# Patient Record
Sex: Female | Born: 1970 | Race: Black or African American | Hispanic: No | Marital: Single | Smoking: Never smoker
Health system: Southern US, Community
[De-identification: ages and names within clinical notes are randomized; demographics above are authoritative.]

## PROBLEM LIST (undated history)

## (undated) DIAGNOSIS — F419 Anxiety disorder, unspecified: Secondary | ICD-10-CM

## (undated) DIAGNOSIS — E785 Hyperlipidemia, unspecified: Secondary | ICD-10-CM

## (undated) DIAGNOSIS — M1990 Unspecified osteoarthritis, unspecified site: Secondary | ICD-10-CM

## (undated) DIAGNOSIS — F32A Depression, unspecified: Secondary | ICD-10-CM

## (undated) HISTORY — PX: TUBAL LIGATION: SHX77

## (undated) HISTORY — DX: Anxiety disorder, unspecified: F41.9

## (undated) HISTORY — DX: Depression, unspecified: F32.A

## (undated) HISTORY — PX: LAPAROSCOPIC GASTRIC SLEEVE RESECTION: SHX5895

## (undated) HISTORY — DX: Hyperlipidemia, unspecified: E78.5

---

## 2014-05-10 HISTORY — PX: LAPAROSCOPIC GASTRIC SLEEVE RESECTION: SHX5895

## 2017-05-18 ENCOUNTER — Emergency Department (HOSPITAL_COMMUNITY): Payer: BLUE CROSS/BLUE SHIELD

## 2017-05-18 ENCOUNTER — Encounter (HOSPITAL_COMMUNITY): Payer: Self-pay | Admitting: Family Medicine

## 2017-05-18 ENCOUNTER — Encounter (HOSPITAL_COMMUNITY): Payer: Self-pay | Admitting: Emergency Medicine

## 2017-05-18 ENCOUNTER — Ambulatory Visit (HOSPITAL_COMMUNITY)
Admission: EM | Admit: 2017-05-18 | Discharge: 2017-05-18 | Disposition: A | Discharge status: Nursing Home (Includes ICF) | Payer: BLUE CROSS/BLUE SHIELD | Attending: Internal Medicine | Admitting: Internal Medicine

## 2017-05-18 ENCOUNTER — Emergency Department (HOSPITAL_COMMUNITY)
Admission: EM | Admit: 2017-05-18 | Discharge: 2017-05-18 | Disposition: A | Discharge status: Home / Self Care | Payer: BLUE CROSS/BLUE SHIELD | Attending: Emergency Medicine | Admitting: Emergency Medicine

## 2017-05-18 DIAGNOSIS — R079 Chest pain, unspecified: Secondary | ICD-10-CM | POA: Diagnosis not present

## 2017-05-18 DIAGNOSIS — Z5321 Procedure and treatment not carried out due to patient leaving prior to being seen by health care provider: Secondary | ICD-10-CM | POA: Insufficient documentation

## 2017-05-18 DIAGNOSIS — R0602 Shortness of breath: Secondary | ICD-10-CM | POA: Diagnosis present

## 2017-05-18 HISTORY — DX: Unspecified osteoarthritis, unspecified site: M19.90

## 2017-05-18 LAB — CBC
HCT: 39.9 % (ref 36.0–46.0)
Hemoglobin: 13.4 g/dL (ref 12.0–15.0)
MCH: 30.6 pg (ref 26.0–34.0)
MCHC: 33.6 g/dL (ref 30.0–36.0)
MCV: 91.1 fL (ref 78.0–100.0)
PLATELETS: 368 10*3/uL (ref 150–400)
RBC: 4.38 MIL/uL (ref 3.87–5.11)
RDW: 13 % (ref 11.5–15.5)
WBC: 7.9 10*3/uL (ref 4.0–10.5)

## 2017-05-18 LAB — BASIC METABOLIC PANEL
Anion gap: 8 (ref 5–15)
BUN: 6 mg/dL (ref 6–20)
CALCIUM: 8.7 mg/dL — AB (ref 8.9–10.3)
CO2: 23 mmol/L (ref 22–32)
CREATININE: 0.73 mg/dL (ref 0.44–1.00)
Chloride: 104 mmol/L (ref 101–111)
GFR calc non Af Amer: >60 mL/min (ref >60)
Glucose, Bld: 80 mg/dL (ref 65–99)
Potassium: 3.9 mmol/L (ref 3.5–5.1)
SODIUM: 135 mmol/L (ref 135–145)

## 2017-05-18 LAB — I-STAT TROPONIN, ED: TROPONIN I, POC: 0 ng/mL (ref 0.00–0.08)

## 2017-05-18 NOTE — ED Provider Notes (Signed)
CSN: [id number]     Arrival date & time 05/18/17  1846 History   None    Chief Complaint  Patient presents with  . Chest Pain   (Consider location/radiation/quality/duration/timing/severity/associated sxs/prior Treatment) Patient is a fairly healthy 46 y.o. Female with insignificant history, presents today for chest pain onset today while at rest accompany by shortness of breath and nausea. Nausea has resolved. She reports difficulty catching her breath; feels like she is trying to breath as if she is running. Chest pain locates at the center chest radiates to back. Pain is describe as pressure and heavy "like there is an elephant sitting on my chest". Denies chest wall tenderness. She endorses laying down makes SOB and CP worse. She denies jaw pain, arm pain, vomiting, abdominal pain, fatigue.         Past Medical History:  Diagnosis Date  . Arthritis    Past Surgical History:  Procedure Laterality Date  . CESAREAN SECTION    . LAPAROSCOPIC GASTRIC SLEEVE RESECTION    . TUBAL LIGATION     History reviewed. No pertinent family history. Social History  Substance Use Topics  . Smoking status: Not on file  . Smokeless tobacco: Not on file  . Alcohol use Not on file   OB History    No data available     Review of Systems  Constitutional: Negative for chills, fatigue and fever.  Respiratory: Positive for shortness of breath. Negative for cough and wheezing.   Cardiovascular: Positive for chest pain.  Gastrointestinal: Positive for nausea. Negative for abdominal pain, diarrhea and vomiting.  Musculoskeletal: Positive for back pain.  Neurological: Negative for dizziness and headaches.    Allergies  Patient has no known allergies.  Home Medications   Prior to Admission medications   Not on File   Meds Ordered and Administered this Visit  Medications - No data to display  BP 113/81   Pulse 70   Temp 98 F (36.7 C)   Resp 18   LMP 05/11/2017   SpO2 97%  No data  found.   Physical Exam  Constitutional: She is oriented to person, place, and time. She appears well-developed and well-nourished.  HENT:  Head: Normocephalic.  Right Ear: External ear normal.  Left Ear: External ear normal.  Nose: Nose normal.  Mouth/Throat: Oropharynx is clear and moist. No oropharyngeal exudate.  TM pearly gray with no erythema  Eyes: Conjunctivae are normal. Pupils are equal, round, and reactive to light.  Neck: Normal range of motion. Neck supple.  Cardiovascular: Normal rate, regular rhythm and normal heart sounds.   No murmur heard. No chest tenderness on palpation.   Pulmonary/Chest: Effort normal and breath sounds normal. No respiratory distress. She has no wheezes.  Abdominal: Soft. Bowel sounds are normal. There is no tenderness.  Musculoskeletal:  Struggles with laying down due to chest pain, pain worsen with laying down.   Lymphadenopathy:    She has no cervical adenopathy.  Neurological: She is alert and oriented to person, place, and time. Coordination normal.  Skin: Skin is warm and dry.  Psychiatric:  Appears anxious  Nursing note and vitals reviewed.   Urgent Care Course     Procedures (including critical care time)  Labs Review Labs Reviewed - No data to display  Imaging Review No results found.  MDM   1. Chest pain, unspecified type    EKG has NSR. Vital signs are appropriate. Most likely musculoskeletal, but given her symptoms and the characteristic of her  pain accompany by SOB,  will transfer patient via POV to ER for further comprehensive evaluation.     Lucia EstelleZheng, Quintel Mccalla, NP 05/18/17 1940

## 2017-05-18 NOTE — ED Notes (Signed)
At the desk complaining about the long wait.  Encouraged to stay but says no I am leaving.

## 2017-05-18 NOTE — ED Triage Notes (Signed)
Pt c/o 9/10 central CP and SOB that started this morning, having nausea and pain is radiating to her back.

## 2017-05-18 NOTE — ED Triage Notes (Signed)
Pt here for chest pain that started today with SOB and nausea,. sts that the [ain is worse with moving and breathing and with palpation. sts radiates into her back. Denies injury, coughing, fever, congestion.

## 2020-03-24 ENCOUNTER — Ambulatory Visit: Payer: BC Managed Care – PPO

## 2021-02-16 ENCOUNTER — Other Ambulatory Visit: Payer: Self-pay

## 2021-02-16 ENCOUNTER — Ambulatory Visit (INDEPENDENT_AMBULATORY_CARE_PROVIDER_SITE_OTHER): Payer: Managed Care, Other (non HMO) | Admitting: Family Medicine

## 2021-02-16 ENCOUNTER — Encounter: Payer: Self-pay | Admitting: Family Medicine

## 2021-02-16 VITALS — BP 121/84 | HR 72 | Ht 62.0 in | Wt 245.7 lb

## 2021-02-16 DIAGNOSIS — F3341 Major depressive disorder, recurrent, in partial remission: Secondary | ICD-10-CM | POA: Diagnosis not present

## 2021-02-16 DIAGNOSIS — F419 Anxiety disorder, unspecified: Secondary | ICD-10-CM | POA: Insufficient documentation

## 2021-02-16 DIAGNOSIS — Z6841 Body Mass Index (BMI) 40.0 and over, adult: Secondary | ICD-10-CM

## 2021-02-16 DIAGNOSIS — Z7689 Persons encountering health services in other specified circumstances: Secondary | ICD-10-CM | POA: Insufficient documentation

## 2021-02-16 MED ORDER — METHOCARBAMOL 750 MG PO TABS: 750.0000 mg | ORAL_TABLET | Freq: Four times a day (QID) | ORAL | 2 refills | Status: DC

## 2021-02-16 MED ORDER — ESCITALOPRAM OXALATE 20 MG PO TABS: 20.0000 mg | ORAL_TABLET | Freq: Every day | ORAL | 6 refills | Status: DC

## 2021-02-16 NOTE — Assessment & Plan Note (Signed)
Patient in today to establish primary care, she is doing well on her Lexapro for Major Depression and Anxiety. 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

## 2021-02-16 NOTE — Assessment & Plan Note (Signed)
Patient has several days that she has felt down and depressed. She has been out of her Lexapro.   Plan start Lexapro 20 mg Back.

## 2021-02-16 NOTE — Assessment & Plan Note (Signed)
Patient had gastric sleeve 5 years ago in [place]. She lost 80 lbs and gained 45 back. She is on a diet now. I encouraged her to consider the Mediterranean diet.

## 2021-02-16 NOTE — Progress Notes (Signed)
Established Patient Office Visit  SUBJECTIVE:  Subjective  Patient ID: Tracy Kane, female    DOB: Sep 20, 1971  Age: 50 y.o. MRN: [id number]  CC:  Chief Complaint  Patient presents with  . New Patient (Initial Visit)    HPI Tracy Kane is a 50 y.o. female presenting today for     Past Medical History:  Diagnosis Date  . Anxiety   . Arthritis   . Depression     Past Surgical History:  Procedure Laterality Date  . CESAREAN SECTION    . LAPAROSCOPIC GASTRIC SLEEVE RESECTION    . TUBAL LIGATION      History reviewed. No pertinent family history.  Social History   Socioeconomic History  . Marital status: Single    Spouse name: Not on file  . Number of children: Not on file  . Years of education: Not on file  . Highest education level: Not on file  Occupational History  . Not on file  Tobacco Use  . Smoking status: Never Smoker  . Smokeless tobacco: Never Used  Substance and Sexual Activity  . Alcohol use: No  . Drug use: No  . Sexual activity: Not Currently  Other Topics Concern  . Not on file  Social History Narrative  . Not on file   Social Determinants of Health   Financial Resource Strain: Not on file  Food Insecurity: Not on file  Transportation Needs: Not on file  Physical Activity: Not on file  Stress: Not on file  Social Connections: Not on file  Intimate Partner Violence: Not on file     Current Outpatient Medications:  .  methocarbamol (ROBAXIN-750) 750 MG tablet, Take 1 tablet (750 mg total) by mouth 4 (four) times daily., Disp: 30 tablet, Rfl: 2 .  escitalopram (LEXAPRO) 20 MG tablet, Take 1 tablet (20 mg total) by mouth daily., Disp: 30 tablet, Rfl: 6   No Known Allergies  ROS Review of Systems  Constitutional: Negative.   HENT: Negative.   Respiratory: Negative.   Cardiovascular: Negative.   Musculoskeletal: Negative.   Neurological: Negative.   Psychiatric/Behavioral: Negative.      OBJECTIVE:    Physical  Exam Constitutional:      Appearance: She is obese.  HENT:     Head: Normocephalic.     Mouth/Throat:     Mouth: Mucous membranes are moist.  Cardiovascular:     Rate and Rhythm: Normal rate and regular rhythm.  Musculoskeletal:        General: Normal range of motion.  Skin:    General: Skin is warm.  Neurological:     Mental Status: She is alert.  Psychiatric:        Mood and Affect: Mood normal.     BP 121/84   Pulse 72   Ht 5\' 2"  (1.575 m)   Wt 245 lb 11.2 oz (111.4 kg)   BMI 44.94 kg/m  Wt Readings from Last 3 Encounters:  02/16/21 245 lb 11.2 oz (111.4 kg)  05/18/17 204 lb (92.5 kg)    Health Maintenance Due  Topic Date Due  . Hepatitis C Screening  Never done  . COVID-19 Vaccine (1) Never done  . HIV Screening  Never done  . TETANUS/TDAP  Never done  . PAP SMEAR-Modifier  Never done  . COLONOSCOPY (Pts 45-78yrs Insurance coverage will need to be confirmed)  Never done  . INFLUENZA VACCINE  Never done    There are no preventive care reminders to display for this  patient.  CBC Latest Ref Rng & Units 05/18/2017  WBC 4.0 - 10.5 K/uL 7.9  Hemoglobin 12.0 - 15.0 g/dL 17.0  Hematocrit 01.7 - 46.0 % 39.9  Platelets 150 - 400 K/uL 368   CMP Latest Ref Rng & Units 05/18/2017  Glucose 65 - 99 mg/dL 80  BUN 6 - 20 mg/dL 6  Creatinine 4.94 - 4.96 mg/dL 7.59  Sodium 163 - 846 mmol/L 135  Potassium 3.5 - 5.1 mmol/L 3.9  Chloride 101 - 111 mmol/L 104  CO2 22 - 32 mmol/L 23  Calcium 8.9 - 10.3 mg/dL 6.5(L)    No results found for: TSH Lab Results  Component Value Date   ANIONGAP 8 05/18/2017   No results found for: CHOL, HDL, LDLCALC, CHOLHDL No results found for: TRIG No results found for: HGBA1C    ASSESSMENT & PLAN:   Problem List Items Addressed This Visit      Other   Encounter to establish care - Primary    Patient in today to establish primary care, she is doing well on her Lexapro for Major Depression and Anxiety. 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       Class 3 severe obesity due to excess calories without serious comorbidity with body mass index (BMI) of 40.0 to 44.9 in adult [facility])    Patient had gastric sleeve 5 years ago in [place]. She lost 80 lbs and gained 45 back. She is on a diet now. I encouraged her to consider the Mediterranean diet.        Recurrent major depressive disorder, in partial remission [facility])    Patient has several days that she has felt down and depressed. She has been out of her Lexapro.   Plan start Lexapro 20 mg Back.       Relevant Medications   escitalopram (LEXAPRO) 20 MG tablet      Meds ordered this encounter  Medications  . escitalopram (LEXAPRO) 20 MG tablet    Sig: Take 1 tablet (20 mg total) by mouth daily.    Dispense:  30 tablet    Refill:  6  . methocarbamol (ROBAXIN-750) 750 MG tablet    Sig: Take 1 tablet (750 mg total) by mouth 4 (four) times daily.    Dispense:  30 tablet    Refill:  2      Follow-up: No follow-ups on file.    Irish Lack, FNP [facility] [address] [place], [place] [address]

## 2021-03-16 ENCOUNTER — Encounter: Payer: Managed Care, Other (non HMO) | Admitting: Family Medicine

## 2021-05-12 ENCOUNTER — Other Ambulatory Visit: Payer: Self-pay

## 2021-05-12 DIAGNOSIS — M545 Low back pain, unspecified: Secondary | ICD-10-CM

## 2021-05-12 NOTE — Progress Notes (Signed)
amd

## 2021-06-05 ENCOUNTER — Emergency Department
Admission: EM | Admit: 2021-06-05 | Discharge: 2021-06-05 | Disposition: A | Discharge status: Home / Self Care | Payer: Managed Care, Other (non HMO) | Attending: Emergency Medicine | Admitting: Emergency Medicine

## 2021-06-05 ENCOUNTER — Encounter: Payer: Self-pay | Admitting: Emergency Medicine

## 2021-06-05 ENCOUNTER — Other Ambulatory Visit: Payer: Self-pay

## 2021-06-05 DIAGNOSIS — Z5321 Procedure and treatment not carried out due to patient leaving prior to being seen by health care provider: Secondary | ICD-10-CM | POA: Insufficient documentation

## 2021-06-05 DIAGNOSIS — R0981 Nasal congestion: Secondary | ICD-10-CM | POA: Diagnosis not present

## 2021-06-05 DIAGNOSIS — R059 Cough, unspecified: Secondary | ICD-10-CM | POA: Insufficient documentation

## 2021-06-05 DIAGNOSIS — R519 Headache, unspecified: Secondary | ICD-10-CM | POA: Insufficient documentation

## 2021-06-05 DIAGNOSIS — H9201 Otalgia, right ear: Secondary | ICD-10-CM | POA: Diagnosis not present

## 2021-06-05 NOTE — ED Notes (Signed)
[phone]   Racheal Patches, PA-C 06/05/21 2151

## 2021-06-05 NOTE — ED Triage Notes (Signed)
Patient ambulatory to triage with steady gait, without difficulty or distress noted; pt reports rt earache, nonprod cough since 6/14

## 2021-06-05 NOTE — ED Notes (Signed)
This RN attempted to locate patient in ED lobby. Patient not found. This RN attempted to contact patient via phone, as documented in patient's chart. No answer received. Provider notified. Patient's chart returned to waiting room.

## 2021-09-04 ENCOUNTER — Other Ambulatory Visit: Payer: Self-pay

## 2021-09-04 ENCOUNTER — Ambulatory Visit (INDEPENDENT_AMBULATORY_CARE_PROVIDER_SITE_OTHER): Payer: Managed Care, Other (non HMO) | Admitting: Internal Medicine

## 2021-09-04 ENCOUNTER — Encounter: Payer: Self-pay | Admitting: Internal Medicine

## 2021-09-04 VITALS — BP 123/75 | HR 72 | Temp 97.3°F | Resp 17 | Ht 62.0 in | Wt 243.8 lb

## 2021-09-04 DIAGNOSIS — Z23 Encounter for immunization: Secondary | ICD-10-CM

## 2021-09-04 DIAGNOSIS — E782 Mixed hyperlipidemia: Secondary | ICD-10-CM

## 2021-09-04 DIAGNOSIS — K219 Gastro-esophageal reflux disease without esophagitis: Secondary | ICD-10-CM | POA: Diagnosis not present

## 2021-09-04 DIAGNOSIS — Z9884 Bariatric surgery status: Secondary | ICD-10-CM | POA: Diagnosis not present

## 2021-09-04 DIAGNOSIS — N951 Menopausal and female climacteric states: Secondary | ICD-10-CM

## 2021-09-04 DIAGNOSIS — F419 Anxiety disorder, unspecified: Secondary | ICD-10-CM

## 2021-09-04 DIAGNOSIS — M8949 Other hypertrophic osteoarthropathy, multiple sites: Secondary | ICD-10-CM

## 2021-09-04 DIAGNOSIS — Z114 Encounter for screening for human immunodeficiency virus [HIV]: Secondary | ICD-10-CM

## 2021-09-04 DIAGNOSIS — Z1159 Encounter for screening for other viral diseases: Secondary | ICD-10-CM

## 2021-09-04 DIAGNOSIS — F32A Depression, unspecified: Secondary | ICD-10-CM

## 2021-09-04 DIAGNOSIS — M1990 Unspecified osteoarthritis, unspecified site: Secondary | ICD-10-CM | POA: Insufficient documentation

## 2021-09-04 DIAGNOSIS — M159 Polyosteoarthritis, unspecified: Secondary | ICD-10-CM

## 2021-09-04 DIAGNOSIS — M150 Primary generalized (osteo)arthritis: Secondary | ICD-10-CM

## 2021-09-04 NOTE — Assessment & Plan Note (Signed)
Encouraged her to try to identify foods that trigger her reflux and avoid them Encourage weight loss as this can help reduce reflux symptoms Continue Omeprazole

## 2021-09-04 NOTE — Assessment & Plan Note (Signed)
Stable on Escitalopram Support offered 

## 2021-09-04 NOTE — Assessment & Plan Note (Signed)
Encourage weight loss as this can produce joint pain She is following with [facility]

## 2021-09-04 NOTE — Addendum Note (Signed)
Addended by: Lonna Cobb on: 09/04/2021 11:47 AM   Modules accepted: Orders

## 2021-09-04 NOTE — Progress Notes (Signed)
HPI  Patient presents the clinic today to establish care.  She is transferring care from Marya Fossa, NP.  Anxiety and Depression: Chronic, managed on Escitalopram.  She is not currently seeing a therapist.  She denies SI/HI.  GERD: She is not sure what triggers this. She takes Omeprazole as needed with good relief. There is no upper GI on file.  OA: Mainly in her low back, left hip and bilateral hip.  She is following with [facility] in [place].  She reports she needs to have her B12 and iron levels checked.  She had a sleeve gastrectomy in 2015 and has not followed up with this in the last year.  Past Medical History:  Diagnosis Date   Anxiety    Arthritis    Depression     Current Outpatient Medications  Medication Sig Dispense Refill   escitalopram (LEXAPRO) 20 MG tablet Take 1 tablet (20 mg total) by mouth daily. 30 tablet 6   methocarbamol (ROBAXIN-750) 750 MG tablet Take 1 tablet (750 mg total) by mouth 4 (four) times daily. 30 tablet 2   No current facility-administered medications for this visit.    No Known Allergies  No family history on file.  Social History   Socioeconomic History   Marital status: Single    Spouse name: Not on file   Number of children: Not on file   Years of education: Not on file   Highest education level: Not on file  Occupational History   Not on file  Tobacco Use   Smoking status: Never   Smokeless tobacco: Never  Vaping Use   Vaping Use: Never used  Substance and Sexual Activity   Alcohol use: No   Drug use: No   Sexual activity: Not Currently  Other Topics Concern   Not on file  Social History Narrative   Not on file   Social Determinants of Health   Financial Resource Strain: Not on file  Food Insecurity: Not on file  Transportation Needs: Not on file  Physical Activity: Not on file  Stress: Not on file  Social Connections: Not on file  Intimate Partner Violence: Not on file    ROS:  Constitutional:  Denies fever, malaise, fatigue, headache or abrupt weight changes.  HEENT: Denies eye pain, eye redness, ear pain, ringing in the ears, wax buildup, runny nose, nasal congestion, bloody nose, or sore throat. Respiratory: Denies difficulty breathing, shortness of breath, cough or sputum production.   Cardiovascular: Denies chest pain, chest tightness, palpitations or swelling in the hands or feet.  Gastrointestinal: Denies abdominal pain, bloating, constipation, diarrhea or blood in the stool.  GU: Denies frequency, urgency, pain with urination, blood in urine, odor or discharge. Musculoskeletal: Patient reports chronic joint pain, hot of her upper back.  Denies decrease in range of motion, difficulty with gait, muscle pain or joint swelling.  Skin: Denies redness, rashes, lesions or ulcercations.  Neurological: Denies dizziness, difficulty with memory, difficulty with speech or problems with balance and coordination.  Psych: Patient has a history of anxiety and depression. Denies SI/HI.  No other specific complaints in a complete review of systems (except as listed in HPI above).  PE: BP 123/75 (BP Location: Left Arm, Patient Position: Sitting, Cuff Size: Large)   Pulse 72   Temp (!) 97.3 F (36.3 C) (Temporal)   Resp 17   Ht 5\' 2"  (1.575 m)   Wt 243 lb 12.8 oz (110.6 kg)   LMP 06/12/2021   SpO2 100%  BMI 44.59 kg/m   Wt Readings from Last 3 Encounters:  06/05/21 238 lb (108 kg)  02/16/21 245 lb 11.2 oz (111.4 kg)  05/18/17 204 lb (92.5 kg)    General: Appears her stated age, obese, in NAD. Skin: Fat pad noted over upper midline back. HEENT: Head: normal shape and size; Eyes: sclera white and EOMs intact;  Neck: Neck supple, trachea midline. No masses, lumps or thyromegaly present.  Cardiovascular: Normal rate and rhythm. S1,S2 noted.  No murmur, rubs or gallops noted. No JVD or BLE edema. Pulmonary/Chest: Normal effort and positive vesicular breath sounds. No respiratory  distress. No wheezes, rales or ronchi noted.  Abdomen: Soft and nontender. Normal bowel sounds. Musculoskeletal: No bony tenderness noted over the cervical or thoracic spine. No difficulty with gait.  Neurological: Alert and oriented.  Psychiatric: Mood and affect normal. Behavior is normal. Judgment and thought content normal.    BMET    Component Value Date/Time   NA 135 05/18/2017 1952   K 3.9 05/18/2017 1952   CL 104 05/18/2017 1952   CO2 23 05/18/2017 1952   GLUCOSE 80 05/18/2017 1952   BUN 6 05/18/2017 1952   CREATININE 0.73 05/18/2017 1952   CALCIUM 8.7 (L) 05/18/2017 1952   GFRNONAA >60 05/18/2017 1952   GFRAA >60 05/18/2017 1952    Lipid Panel  No results found for: CHOL, TRIG, HDL, CHOLHDL, VLDL, LDLCALC  CBC    Component Value Date/Time   WBC 7.9 05/18/2017 1952   RBC 4.38 05/18/2017 1952   HGB 13.4 05/18/2017 1952   HCT 39.9 05/18/2017 1952   PLT 368 05/18/2017 1952   MCV 91.1 05/18/2017 1952   MCH 30.6 05/18/2017 1952   MCHC 33.6 05/18/2017 1952   RDW 13.0 05/18/2017 1952    Hgb A1C No results found for: HGBA1C   Assessment and Plan:  Fat Pad of Upper Back:  Will check lipid and A1C today Encouraged diet and exercise for weight loss  S/P Sleeve Gastrectomy:  Will check CBC, iron panel, CMET, Lipid, A1C, Vit B 12  Screen for HIV:  HIV antibody today  Screen for Hep C:  Hep C antibody today  Perimenopausal:  FSH/LH today  Return precautions discussed  [name], NP This visit occurred during the SARS-CoV-2 public health emergency.  Safety protocols were in place, including screening questions prior to the visit, additional usage of staff PPE, and extensive cleaning of exam room while observing appropriate contact time as indicated for disinfecting solutions.

## 2021-09-04 NOTE — Patient Instructions (Signed)

## 2021-09-04 NOTE — Assessment & Plan Note (Signed)
Encourage diet and exercise for weight loss 

## 2021-09-05 ENCOUNTER — Encounter: Payer: Self-pay | Admitting: Obstetrics and Gynecology

## 2021-09-05 ENCOUNTER — Encounter: Payer: Managed Care, Other (non HMO) | Admitting: Obstetrics and Gynecology

## 2021-09-05 DIAGNOSIS — Z01419 Encounter for gynecological examination (general) (routine) without abnormal findings: Secondary | ICD-10-CM

## 2021-09-05 DIAGNOSIS — Z1231 Encounter for screening mammogram for malignant neoplasm of breast: Secondary | ICD-10-CM

## 2021-09-05 DIAGNOSIS — Z124 Encounter for screening for malignant neoplasm of cervix: Secondary | ICD-10-CM

## 2021-09-06 LAB — CBC
Hematocrit: 42.1 % (ref 34.0–46.6)
Hemoglobin: 14 g/dL (ref 11.1–15.9)
MCH: 30 pg (ref 26.6–33.0)
MCHC: 33.3 g/dL (ref 31.5–35.7)
MCV: 90 fL (ref 79–97)
Platelets: 402 10*3/uL (ref 150–450)
RBC: 4.67 x10E6/uL (ref 3.77–5.28)
RDW: 12.7 % (ref 11.7–15.4)
WBC: 6 10*3/uL (ref 3.4–10.8)

## 2021-09-06 LAB — COMPREHENSIVE METABOLIC PANEL
ALT: 10 IU/L (ref 0–32)
AST: 18 IU/L (ref 0–40)
Albumin/Globulin Ratio: 1.4 (ref 1.2–2.2)
Albumin: 4.4 g/dL (ref 3.8–4.8)
Alkaline Phosphatase: 60 IU/L (ref 44–121)
BUN/Creatinine Ratio: 9 (ref 9–23)
BUN: 6 mg/dL (ref 6–24)
Bilirubin Total: 0.4 mg/dL (ref 0.0–1.2)
CO2: 23 mmol/L (ref 20–29)
Calcium: 9.6 mg/dL (ref 8.7–10.2)
Chloride: 103 mmol/L (ref 96–106)
Creatinine, Ser: 0.7 mg/dL (ref 0.57–1.00)
Globulin, Total: 3.2 g/dL (ref 1.5–4.5)
Glucose: 85 mg/dL (ref 70–99)
Potassium: 4.6 mmol/L (ref 3.5–5.2)
Sodium: 139 mmol/L (ref 134–144)
Total Protein: 7.6 g/dL (ref 6.0–8.5)
eGFR: 107 mL/min/{1.73_m2} (ref >59)

## 2021-09-06 LAB — IRON,TIBC AND FERRITIN PANEL
Ferritin: 220 ng/mL — ABNORMAL HIGH (ref 15–150)
Iron Saturation: 26 % (ref 15–55)
Iron: 62 ug/dL (ref 27–159)
Total Iron Binding Capacity: 241 ug/dL — ABNORMAL LOW (ref 250–450)
UIBC: 179 ug/dL (ref 131–425)

## 2021-09-06 LAB — VITAMIN B12: Vitamin B-12: 1394 pg/mL — ABNORMAL HIGH (ref 232–1245)

## 2021-09-06 LAB — LIPID PANEL
Chol/HDL Ratio: 4 ratio (ref 0.0–4.4)
Cholesterol, Total: 214 mg/dL — ABNORMAL HIGH (ref 100–199)
HDL: 53 mg/dL (ref >39)
LDL Chol Calc (NIH): 147 mg/dL — ABNORMAL HIGH (ref 0–99)
Triglycerides: 81 mg/dL (ref 0–149)
VLDL Cholesterol Cal: 14 mg/dL (ref 5–40)

## 2021-09-06 LAB — HIV ANTIBODY (ROUTINE TESTING W REFLEX): HIV Screen 4th Generation wRfx: NONREACTIVE

## 2021-09-06 LAB — FSH/LH
FSH: 4.8 m[IU]/mL
LH: 1.6 m[IU]/mL

## 2021-09-06 LAB — HEPATITIS C ANTIBODY: Hep C Virus Ab: <0.1 s/co ratio (ref 0.0–0.9)

## 2021-09-06 LAB — HEMOGLOBIN A1C
Est. average glucose Bld gHb Est-mCnc: 114 mg/dL
Hgb A1c MFr Bld: 5.6 % (ref 4.8–5.6)

## 2021-09-07 NOTE — Addendum Note (Signed)
Addended by: Lorre Munroe on: 09/07/2021 07:52 AM   Modules accepted: Orders

## 2021-10-11 ENCOUNTER — Encounter: Payer: Managed Care, Other (non HMO) | Admitting: Obstetrics and Gynecology

## 2021-10-11 ENCOUNTER — Encounter: Payer: Self-pay | Admitting: Obstetrics and Gynecology

## 2022-05-17 ENCOUNTER — Encounter: Payer: Self-pay | Admitting: Internal Medicine

## 2022-05-17 ENCOUNTER — Ambulatory Visit (INDEPENDENT_AMBULATORY_CARE_PROVIDER_SITE_OTHER): Payer: 59 | Admitting: Internal Medicine

## 2022-05-17 ENCOUNTER — Other Ambulatory Visit (HOSPITAL_COMMUNITY)
Admission: RE | Admit: 2022-05-17 | Discharge: 2022-05-17 | Disposition: A | Discharge status: Home / Self Care | Payer: 59 | Source: Ambulatory Visit | Attending: Internal Medicine | Admitting: Internal Medicine

## 2022-05-17 VITALS — BP 118/76 | HR 84 | Temp 96.8°F | Wt 250.0 lb

## 2022-05-17 DIAGNOSIS — E7800 Pure hypercholesterolemia, unspecified: Secondary | ICD-10-CM | POA: Diagnosis not present

## 2022-05-17 DIAGNOSIS — Z1231 Encounter for screening mammogram for malignant neoplasm of breast: Secondary | ICD-10-CM

## 2022-05-17 DIAGNOSIS — Z1211 Encounter for screening for malignant neoplasm of colon: Secondary | ICD-10-CM | POA: Diagnosis not present

## 2022-05-17 DIAGNOSIS — R768 Other specified abnormal immunological findings in serum: Secondary | ICD-10-CM

## 2022-05-17 DIAGNOSIS — Z23 Encounter for immunization: Secondary | ICD-10-CM

## 2022-05-17 DIAGNOSIS — R700 Elevated erythrocyte sedimentation rate: Secondary | ICD-10-CM

## 2022-05-17 DIAGNOSIS — Z124 Encounter for screening for malignant neoplasm of cervix: Secondary | ICD-10-CM | POA: Insufficient documentation

## 2022-05-17 DIAGNOSIS — Z0001 Encounter for general adult medical examination with abnormal findings: Secondary | ICD-10-CM

## 2022-05-17 DIAGNOSIS — M791 Myalgia, unspecified site: Secondary | ICD-10-CM | POA: Diagnosis not present

## 2022-05-17 DIAGNOSIS — R7982 Elevated C-reactive protein (CRP): Secondary | ICD-10-CM

## 2022-05-17 DIAGNOSIS — M2550 Pain in unspecified joint: Secondary | ICD-10-CM | POA: Diagnosis not present

## 2022-05-17 DIAGNOSIS — D75839 Thrombocytosis, unspecified: Secondary | ICD-10-CM

## 2022-05-17 DIAGNOSIS — M545 Low back pain, unspecified: Secondary | ICD-10-CM | POA: Diagnosis not present

## 2022-05-17 DIAGNOSIS — R1903 Right lower quadrant abdominal swelling, mass and lump: Secondary | ICD-10-CM

## 2022-05-17 NOTE — Progress Notes (Signed)
Subjective:    Patient ID: Tracy Kane, female    DOB: Mar 16, 1971, 51 y.o.   MRN: [id number]  HPI  Patient presents to clinic today for annual exam. She also reports muscle aches. She noticed this 2 months but worse in the last month. She has intermittent numbness, tingling and burning sensation as well. She has been intermittent joint pain (low back, left hip and knees) and swelling- which she has been seeing orthopedics. She has gotten shots in her knees.  Flu: 08/2021 Tetanus: unsure COVID: Pfizer x 2 Pap smear: > 5 years ago Mammogram: > 2 years ago Colon screening: Never Vision screening: annually Dentist: biannually  Diet: She does eat lean meat. She consumes fruits and veggies. She tries to avoid fried foods. She drinks mostly water or green tea. Exercise: Walking   Review of Systems     Past Medical History:  Diagnosis Date   Anxiety    Arthritis    Depression    Hyperlipidemia     Current Outpatient Medications  Medication Sig Dispense Refill   escitalopram (LEXAPRO) 20 MG tablet Take 1 tablet (20 mg total) by mouth daily. 30 tablet 6   omeprazole (PRILOSEC) 20 MG capsule Take 20 mg by mouth daily as needed.     No current facility-administered medications for this visit.    No Known Allergies  No family history on file.  Social History   Socioeconomic History   Marital status: Single    Spouse name: Not on file   Number of children: Not on file   Years of education: Not on file   Highest education level: Not on file  Occupational History   Not on file  Tobacco Use   Smoking status: Never   Smokeless tobacco: Never  Vaping Use   Vaping Use: Never used  Substance and Sexual Activity   Alcohol use: Yes    Comment: occasionally   Drug use: No   Sexual activity: Not Currently  Other Topics Concern   Not on file  Social History Narrative   Not on file   Social Determinants of Health   Financial Resource Strain: Not on file  Food  Insecurity: Not on file  Transportation Needs: Not on file  Physical Activity: Not on file  Stress: Not on file  Social Connections: Not on file  Intimate Partner Violence: Not on file     Constitutional: Denies fever, malaise, fatigue, headache or abrupt weight changes.  HEENT: Denies eye pain, eye redness, ear pain, ringing in the ears, wax buildup, runny nose, nasal congestion, bloody nose, or sore throat. Respiratory: Denies difficulty breathing, shortness of breath, cough or sputum production.   Cardiovascular: Denies chest pain, chest tightness, palpitations or swelling in the hands or feet.  Gastrointestinal: Denies abdominal pain, bloating, constipation, diarrhea or blood in the stool.  GU: Denies urgency, frequency, pain with urination, burning sensation, blood in urine, odor or discharge. Musculoskeletal: Patient reports muscle and joint pain.  Denies decrease in range of motion, difficulty with gait, muscle pain or joint swelling.  Skin: Pt reports mass of abdomen and upper back. Denies redness, rashes, or ulcercations.  Neurological: Denies dizziness, difficulty with memory, difficulty with speech or problems with balance and coordination.  Psych: Patient has a history of anxiety and depression.  Denies SI/HI.  No other specific complaints in a complete review of systems (except as listed in HPI above).  Objective:   Physical Exam  BP 118/76 (BP Location: Right Arm, Patient Position:  Sitting, Cuff Size: Large)   Pulse 84   Temp (!) 96.8 F (36 C) (Temporal)   Wt 250 lb (113.4 kg)   SpO2 98%   BMI 45.73 kg/m   Wt Readings from Last 3 Encounters:  09/04/21 243 lb 12.8 oz (110.6 kg)  06/05/21 238 lb (108 kg)  02/16/21 245 lb 11.2 oz (111.4 kg)    General: Appears her stated age, obese, in NAD. Skin: Warm, dry and intact. No rashes noted. Mass noted  of RLQ, ? Hernia vs lipoma. Enlarged fat pad noted at base of neck. HEENT: Head: normal shape and size; Eyes: sclera  white, no icterus, conjunctiva pink, PERRLA and EOMs intact;  Neck:  Neck supple, trachea midline. No masses, lumps or thyromegaly present.  Cardiovascular: Normal rate and rhythm. S1,S2 noted.  No murmur, rubs or gallops noted. No JVD or BLE edema.  Pulmonary/Chest: Normal effort and positive vesicular breath sounds. No respiratory distress. No wheezes, rales or ronchi noted.  Abdomen: Soft and nontender. Normal bowel sounds.  Pelvic: Normal female anatomy. Cervix without mass or lesion. No CMT. Adnexa non palpable. Musculoskeletal: Strength 5/5 BUE/BLE. No signs of joint swelling. No muscular tender spots noted. No difficulty with gait.  Neurological: Alert and oriented. Cranial nerves II-XII grossly intact. Coordination normal.  Psychiatric: Mood and affect normal. Behavior is normal. Judgment and thought content normal.     BMET    Component Value Date/Time   NA 139 09/05/2021 1414   K 4.6 09/05/2021 1414   CL 103 09/05/2021 1414   CO2 23 09/05/2021 1414   GLUCOSE 85 09/05/2021 1414   GLUCOSE 80 05/18/2017 1952   BUN 6 09/05/2021 1414   CREATININE 0.70 09/05/2021 1414   CALCIUM 9.6 09/05/2021 1414   GFRNONAA >60 05/18/2017 1952   GFRAA >60 05/18/2017 1952    Lipid Panel     Component Value Date/Time   CHOL 214 (H) 09/05/2021 1414   TRIG 81 09/05/2021 1414   HDL 53 09/05/2021 1414   CHOLHDL 4.0 09/05/2021 1414   LDLCALC 147 (H) 09/05/2021 1414    CBC    Component Value Date/Time   WBC 6.0 09/05/2021 1414   WBC 7.9 05/18/2017 1952   RBC 4.67 09/05/2021 1414   RBC 4.38 05/18/2017 1952   HGB 14.0 09/05/2021 1414   HCT 42.1 09/05/2021 1414   PLT 402 09/05/2021 1414   MCV 90 09/05/2021 1414   MCH 30.0 09/05/2021 1414   MCH 30.6 05/18/2017 1952   MCHC 33.3 09/05/2021 1414   MCHC 33.6 05/18/2017 1952   RDW 12.7 09/05/2021 1414    Hgb A1C Lab Results  Component Value Date   HGBA1C 5.6 09/05/2021           Assessment & Plan:   Preventative Health  Maintenance:  Encouraged her to get a flu shot in the fall Tetanus today Encouraged her to get her COVID-vaccine Pap smear today with STD screening Mammogram ordered-she will call to schedule Referral to GI for screening colonoscopy Encouraged her to consume a balanced diet and exercise regimen Advised her to see an eye doctor and dentist annually We will check CBC, c-Met, lipid, A1c today  Multiple Joint Pain, Myalgias:  Will check ANA, ESR, CRP, RF and Lupus antibodies Referral to orthopedics for further evaluation and treatment  Mass of Abdomen:  Will obtain US abdomen for further evaluation  RTC in 6 months, follow-up chronic conditions Webb Silversmith, NP

## 2022-05-17 NOTE — Assessment & Plan Note (Signed)
Encouraged diet and exercise for weight loss ?

## 2022-05-17 NOTE — Patient Instructions (Signed)
Health Maintenance After Age 49 After age 49, you are at a higher risk for certain long-term diseases and infections as well as injuries from falls. Falls are a major cause of broken bones and head injuries in people who are older than age 49. Getting regular preventive care can help to keep you healthy and well. Preventive care includes getting regular testing and making lifestyle changes as recommended by your health care provider. Talk with your health care provider about: Which screenings and tests you should have. A screening is a test that checks for a disease when you have no symptoms. A diet and exercise plan that is right for you. What should I know about screenings and tests to prevent falls? Screening and testing are the best ways to find a health problem early. Early diagnosis and treatment give you the best chance of managing medical conditions that are common after age 49. Certain conditions and lifestyle choices may make you more likely to have a fall. Your health care provider may recommend: Regular vision checks. Poor vision and conditions such as cataracts can make you more likely to have a fall. If you wear glasses, make sure to get your prescription updated if your vision changes. Medicine review. Work with your health care provider to regularly review all of the medicines you are taking, including over-the-counter medicines. Ask your health care provider about any side effects that may make you more likely to have a fall. Tell your health care provider if any medicines that you take make you feel dizzy or sleepy. Strength and balance checks. Your health care provider may recommend certain tests to check your strength and balance while standing, walking, or changing positions. Foot health exam. Foot pain and numbness, as well as not wearing proper footwear, can make you more likely to have a fall. Screenings, including: Osteoporosis screening. Osteoporosis is a condition that causes  the bones to get weaker and break more easily. Blood pressure screening. Blood pressure changes and medicines to control blood pressure can make you feel dizzy. Depression screening. You may be more likely to have a fall if you have a fear of falling, feel depressed, or feel unable to do activities that you used to do. Alcohol use screening. Using too much alcohol can affect your balance and may make you more likely to have a fall. Follow these instructions at home: Lifestyle Do not drink alcohol if: Your health care provider tells you not to drink. If you drink alcohol: Limit how much you have to: 0-1 drink a day for women. 0-2 drinks a day for men. Know how much alcohol is in your drink. In the [place] one drink equals one 12 oz bottle of beer (355 mL), one 5 oz glass of wine (148 mL), or one 1 oz glass of hard liquor (44 mL). Do not use any products that contain nicotine or tobacco. These products include cigarettes, chewing tobacco, and vaping devices, such as e-cigarettes. If you need help quitting, ask your health care provider. Activity  Follow a regular exercise program to stay fit. This will help you maintain your balance. Ask your health care provider what types of exercise are appropriate for you. If you need a cane or walker, use it as recommended by your health care provider. Wear supportive shoes that have nonskid soles. Safety  Remove any tripping hazards, such as rugs, cords, and clutter. Install safety equipment such as grab bars in bathrooms and safety rails on stairs. Keep rooms and walkways   well-lit. General instructions Talk with your health care provider about your risks for falling. Tell your health care provider if: You fall. Be sure to tell your health care provider about all falls, even ones that seem minor. You feel dizzy, tiredness (fatigue), or off-balance. Take over-the-counter and prescription medicines only as told by your health care provider. These include  supplements. Eat a healthy diet and maintain a healthy weight. A healthy diet includes low-fat dairy products, low-fat (lean) meats, and fiber from whole grains, beans, and lots of fruits and vegetables. Stay current with your vaccines. Schedule regular health, dental, and eye exams. Summary Having a healthy lifestyle and getting preventive care can help to protect your health and wellness after age 49. Screening and testing are the best way to find a health problem early and help you avoid having a fall. Early diagnosis and treatment give you the best chance for managing medical conditions that are more common for people who are older than age 49. Falls are a major cause of broken bones and head injuries in people who are older than age 49. Take precautions to prevent a fall at home. Work with your health care provider to learn what changes you can make to improve your health and wellness and to prevent falls. This information is not intended to replace advice given to you by your health care provider. Make sure you discuss any questions you have with your health care provider. Document Revised: 04/17/2021 Document Reviewed: 04/17/2021 Elsevier Patient Education  2023 Elsevier Inc.  

## 2022-05-18 ENCOUNTER — Other Ambulatory Visit: Payer: Self-pay | Admitting: Internal Medicine

## 2022-05-21 ENCOUNTER — Telehealth: Payer: Self-pay

## 2022-05-21 ENCOUNTER — Ambulatory Visit
Admission: RE | Admit: 2022-05-21 | Discharge: 2022-05-21 | Disposition: A | Discharge status: Home / Self Care | Payer: 59 | Source: Ambulatory Visit | Attending: Internal Medicine | Admitting: Internal Medicine

## 2022-05-21 ENCOUNTER — Other Ambulatory Visit: Payer: Self-pay

## 2022-05-21 ENCOUNTER — Telehealth: Payer: Self-pay | Admitting: Internal Medicine

## 2022-05-21 DIAGNOSIS — D75839 Thrombocytosis, unspecified: Secondary | ICD-10-CM | POA: Insufficient documentation

## 2022-05-21 DIAGNOSIS — E7800 Pure hypercholesterolemia, unspecified: Secondary | ICD-10-CM

## 2022-05-21 DIAGNOSIS — R1903 Right lower quadrant abdominal swelling, mass and lump: Secondary | ICD-10-CM | POA: Diagnosis present

## 2022-05-21 LAB — COMPLETE METABOLIC PANEL WITH GFR
AG Ratio: 1.2 (calc) (ref 1.0–2.5)
ALT: 13 U/L (ref 6–29)
AST: 15 U/L (ref 10–35)
Albumin: 4 g/dL (ref 3.6–5.1)
Alkaline phosphatase (APISO): 53 U/L (ref 31–125)
BUN: 9 mg/dL (ref 7–25)
CO2: 26 mmol/L (ref 20–32)
Calcium: 9.4 mg/dL (ref 8.6–10.2)
Chloride: 107 mmol/L (ref 98–110)
Creat: 0.64 mg/dL (ref 0.50–0.99)
Globulin: 3.4 g/dL (calc) (ref 1.9–3.7)
Glucose, Bld: 78 mg/dL (ref 65–99)
Potassium: 4 mmol/L (ref 3.5–5.3)
Sodium: 142 mmol/L (ref 135–146)
Total Bilirubin: 0.5 mg/dL (ref 0.2–1.2)
Total Protein: 7.4 g/dL (ref 6.1–8.1)
eGFR: 109 mL/min/{1.73_m2} (ref >=60)

## 2022-05-21 LAB — CBC
HCT: 42.7 % (ref 35.0–45.0)
Hemoglobin: 14.2 g/dL (ref 11.7–15.5)
MCH: 30.5 pg (ref 27.0–33.0)
MCHC: 33.3 g/dL (ref 32.0–36.0)
MCV: 91.8 fL (ref 80.0–100.0)
MPV: 9.2 fL (ref 7.5–12.5)
Platelets: 408 10*3/uL — ABNORMAL HIGH (ref 140–400)
RBC: 4.65 10*6/uL (ref 3.80–5.10)
RDW: 12.4 % (ref 11.0–15.0)
WBC: 5.6 10*3/uL (ref 3.8–10.8)

## 2022-05-21 LAB — CYTOLOGY - PAP
Adequacy: ABSENT
Chlamydia: NEGATIVE
Comment: NEGATIVE
Comment: NEGATIVE
Comment: NORMAL
Diagnosis: NEGATIVE
Neisseria Gonorrhea: NEGATIVE
Trichomonas: NEGATIVE

## 2022-05-21 LAB — RHEUMATOID FACTOR: Rheumatoid fact SerPl-aCnc: <14 IU/mL (ref <14)

## 2022-05-21 LAB — ANTI-NUCLEAR AB-TITER (ANA TITER): ANA Titer 1: 1:1280 {titer} — ABNORMAL HIGH

## 2022-05-21 LAB — LIPID PANEL
Cholesterol: 224 mg/dL — ABNORMAL HIGH (ref <200)
HDL: 63 mg/dL (ref >=50)
LDL Cholesterol (Calc): 142 mg/dL (calc) — ABNORMAL HIGH
Non-HDL Cholesterol (Calc): 161 mg/dL (calc) — ABNORMAL HIGH (ref <130)
Total CHOL/HDL Ratio: 3.6 (calc) (ref <5.0)
Triglycerides: 87 mg/dL (ref <150)

## 2022-05-21 LAB — SEDIMENTATION RATE: Sed Rate: 31 mm/h — ABNORMAL HIGH (ref 0–20)

## 2022-05-21 LAB — HEMOGLOBIN A1C
Hgb A1c MFr Bld: 5.4 % of total Hgb (ref <5.7)
Mean Plasma Glucose: 108 mg/dL
eAG (mmol/L): 6 mmol/L

## 2022-05-21 LAB — C-REACTIVE PROTEIN: CRP: 17.8 mg/L — ABNORMAL HIGH (ref <8.0)

## 2022-05-21 LAB — ANA: Anti Nuclear Antibody (ANA): POSITIVE — AB

## 2022-05-21 LAB — ANTI-DNA ANTIBODY, DOUBLE-STRANDED: ds DNA Ab: <1 IU/mL

## 2022-05-21 LAB — TSH: TSH: 1.73 mIU/L

## 2022-05-21 MED ORDER — OMEPRAZOLE 20 MG PO CPDR: 20.0000 mg | DELAYED_RELEASE_CAPSULE | Freq: Every day | ORAL | 1 refills | Status: DC | PRN

## 2022-05-21 MED ORDER — ATORVASTATIN CALCIUM 10 MG PO TABS
10.0000 mg | ORAL_TABLET | Freq: Every day | ORAL | 2 refills | Status: DC
Start: 2022-05-21 — End: 2023-06-25

## 2022-05-21 MED ORDER — OMEPRAZOLE 20 MG PO CPDR
20.0000 mg | DELAYED_RELEASE_CAPSULE | Freq: Every day | ORAL | 1 refills | Status: DC | PRN
  Filled 2022-05-21: qty 90, 90d supply, fill #0

## 2022-05-21 MED ORDER — ESCITALOPRAM OXALATE 20 MG PO TABS: 20.0000 mg | ORAL_TABLET | Freq: Every day | ORAL | 1 refills | Status: DC

## 2022-05-21 NOTE — Telephone Encounter (Signed)
Pt advised.  She agreed to start a cholesterol medicine.  Please send to [facility].   Pt also requested escitalopram and omperazole to be sent to [facility] as well.   Thanks,   Vernona Rieger

## 2022-05-21 NOTE — Telephone Encounter (Signed)
Copied from CRM [id number]. Topic: General - Other >> May 21, 2022 10:58 AM Everette C wrote: Reason for CRM: Medication Refill - Medication: escitalopram (LEXAPRO) 20 MG tablet [[id number]  Has the patient contacted their pharmacy? Yes.  The patient has been directed to contact their PCP (Agent: If no, request that the patient contact the pharmacy for the refill. If patient does not wish to contact the pharmacy document the reason why and proceed with request.) (Agent: If yes, when and what did the pharmacy advise?)  Preferred Pharmacy (with phone number or street name): [facility] [address]), [place] - [address]) [place] [address] Phone: [phone] Fax: [phone] Hours: Not open 24 hours   Has the patient been seen for an appointment in the last year OR does the patient have an upcoming appointment? Yes.    Agent: Please be advised that RX refills may take up to 3 business days. We ask that you follow-up with your pharmacy.

## 2022-05-21 NOTE — Telephone Encounter (Signed)
Atorvastatin sent to pharmacy. Have her schedule 3 month lab only appt for repeat lipid.

## 2022-05-21 NOTE — Addendum Note (Signed)
Addended by: Lorre Munroe on: 05/21/2022 12:27 PM   Modules accepted: Orders

## 2022-05-21 NOTE — Telephone Encounter (Signed)
-----   Message from Jearld Fenton, NP sent at 05/18/2022 11:53 AM EDT ----- Her platelets are marginally elevated but this is not concerning at this time, just something we will monitor.  Blood counts otherwise normal.  Her CRP and sed rate are elevated.  Her rheumatoid factor is negative.  I am still waiting on her ANA.  Once that is back, we will likely refer her to rheumatology for further evaluation of these elevated labs and her symptoms of muscle and joint pains.  Her cholesterol is elevated.  I would recommend cholesterol-lowering medication at this time.  Please let me know if she is agreeable and I will send this in.  Liver and kidney function is normal.  Thyroid function is normal.  She does not have diabetes.

## 2022-05-21 NOTE — Telephone Encounter (Signed)
Already ordered today by provider. 

## 2022-05-21 NOTE — Addendum Note (Signed)
Addended by: Lorre Munroe on: 05/21/2022 08:27 AM   Modules accepted: Orders

## 2022-05-21 NOTE — Telephone Encounter (Signed)
CALLED PATIENT NO ANSWER LEFT VOICEMAIL FOR A CALL BACK °Letter sent °

## 2022-05-22 ENCOUNTER — Other Ambulatory Visit: Payer: Self-pay

## 2022-05-23 NOTE — Addendum Note (Signed)
Addended by: Lorre Munroe on: 05/23/2022 09:46 AM   Modules accepted: Orders

## 2022-06-06 ENCOUNTER — Ambulatory Visit: Payer: 59

## 2022-06-08 ENCOUNTER — Encounter: Payer: Self-pay | Admitting: Internal Medicine

## 2022-06-08 ENCOUNTER — Other Ambulatory Visit: Payer: Self-pay | Admitting: Internal Medicine

## 2022-06-08 ENCOUNTER — Ambulatory Visit
Admission: RE | Admit: 2022-06-08 | Discharge: 2022-06-08 | Disposition: A | Discharge status: Home / Self Care | Payer: 59 | Source: Ambulatory Visit | Attending: Internal Medicine | Admitting: Internal Medicine

## 2022-06-08 ENCOUNTER — Telehealth (INDEPENDENT_AMBULATORY_CARE_PROVIDER_SITE_OTHER): Payer: 59 | Admitting: Internal Medicine

## 2022-06-08 DIAGNOSIS — R1903 Right lower quadrant abdominal swelling, mass and lump: Secondary | ICD-10-CM | POA: Diagnosis present

## 2022-06-08 DIAGNOSIS — M5442 Lumbago with sciatica, left side: Secondary | ICD-10-CM

## 2022-06-08 DIAGNOSIS — M5441 Lumbago with sciatica, right side: Secondary | ICD-10-CM | POA: Diagnosis not present

## 2022-06-08 DIAGNOSIS — G8929 Other chronic pain: Secondary | ICD-10-CM | POA: Diagnosis not present

## 2022-06-08 MED ORDER — GADOBUTROL 1 MMOL/ML IV SOLN
10.0000 mL | Freq: Once | INTRAVENOUS | Status: AC | PRN
  Administered 2022-06-08: 10 mL via INTRAVENOUS

## 2022-06-08 MED ORDER — CYCLOBENZAPRINE HCL 10 MG PO TABS: 10.0000 mg | ORAL_TABLET | Freq: Three times a day (TID) | ORAL | 0 refills | Status: DC | PRN

## 2022-06-08 MED ORDER — TRAMADOL HCL 50 MG PO TABS: 50.0000 mg | ORAL_TABLET | Freq: Three times a day (TID) | ORAL | 0 refills | Status: AC | PRN

## 2022-06-08 NOTE — Progress Notes (Signed)
Virtual Visit via Video Note  I connected with Tracy Kane on 06/08/22 at  4:00 PM EDT by a video enabled telemedicine application and verified that I am speaking with the correct person using two identifiers.  Location: Patient: Tracy Kane Provider: Office  Persons participating in this video call: Tracy Reaper, NP and Shrilina Kane   I discussed the limitations of evaluation and management by telemedicine and the availability of in person appointments. The patient expressed understanding and agreed to proceed.  History of Present Illness:  Patient reports bilateral thigh pain.  This started a few years ago but worsened 1-2 weeks ago.  She describes the pain as achy, sore and throbbing. The pain is worse with movement. The pain intermittent radiates into her feet. She reports associated tingling but denies numbness or weakness. She reports associated low back pain which she has had for years. She has had injections previously.  She has tried Tylenol OTC with minimal relief of symptoms.  She has tried PT in the past with minimal relief of symptoms.  She was recently referred to rheumatology for positive ANA.  She reports rheumatology called and told her that they would refer her to physiatry but she has not heard anything about this appointment.   Past Medical History:  Diagnosis Date   Anxiety    Arthritis    Depression    Hyperlipidemia     Current Outpatient Medications  Medication Sig Dispense Refill   atorvastatin (LIPITOR) 10 MG tablet Take 1 tablet (10 mg total) by mouth daily. 30 tablet 2   escitalopram (LEXAPRO) 20 MG tablet Take 1 tablet (20 mg total) by mouth daily. 90 tablet 1   omeprazole (PRILOSEC) 20 MG capsule Take 1 capsule (20 mg total) by mouth daily as needed. 90 capsule 1   No current facility-administered medications for this visit.    No Known Allergies  No family history on file.  Social History   Socioeconomic History   Marital status: Single     Spouse name: Not on file   Number of children: Not on file   Years of education: Not on file   Highest education level: Not on file  Occupational History   Not on file  Tobacco Use   Smoking status: Never   Smokeless tobacco: Never  Vaping Use   Vaping Use: Never used  Substance and Sexual Activity   Alcohol use: Yes    Comment: occasionally   Drug use: No   Sexual activity: Not Currently  Other Topics Concern   Not on file  Social History Narrative   Not on file   Social Determinants of Health   Financial Resource Strain: Not on file  Food Insecurity: Not on file  Transportation Needs: Not on file  Physical Activity: Not on file  Stress: Not on file  Social Connections: Not on file  Intimate Partner Violence: Not on file     Constitutional: Denies fever, malaise, fatigue, headache or abrupt weight changes.  Respiratory: Denies difficulty breathing, shortness of breath, cough or sputum production.   Cardiovascular: Denies chest pain, chest tightness, palpitations or swelling in the hands or feet.  Gastrointestinal: Denies abdominal pain, bloating, constipation, diarrhea or blood in the stool.  GU: Denies urgency, frequency, pain with urination, burning sensation, blood in urine, odor or discharge. Musculoskeletal: Patient reports chronic low back pain, bilateral thigh pain.  Denies decrease in range of motion, difficulty with gait, or joint pain and swelling.  Skin: Denies redness, rashes, lesions or  ulcercations.  Neurological: Patient reports tingling of BLE.  Denies numbness, weakness or problems with balance and coordination.    No other specific complaints in a complete review of systems (except as listed in HPI above).  Observations/Objective:  Wt Readings from Last 3 Encounters:  05/17/22 250 lb (113.4 kg)  09/04/21 243 lb 12.8 oz (110.6 kg)  06/05/21 238 lb (108 kg)    General: Appears her stated age, obese, in NAD. Pulmonary/Chest: Normal effort. No  respiratory distress.  Musculoskeletal: Unable to adequately assess range of motion due to video visit. Neurological: Alert and oriented.    BMET    Component Value Date/Time   NA 142 05/17/2022 1026   NA 139 09/05/2021 1414   K 4.0 05/17/2022 1026   CL 107 05/17/2022 1026   CO2 26 05/17/2022 1026   GLUCOSE 78 05/17/2022 1026   BUN 9 05/17/2022 1026   BUN 6 09/05/2021 1414   CREATININE 0.64 05/17/2022 1026   CALCIUM 9.4 05/17/2022 1026   GFRNONAA >60 05/18/2017 1952   GFRAA >60 05/18/2017 1952    Lipid Panel     Component Value Date/Time   CHOL 224 (H) 05/17/2022 1026   CHOL 214 (H) 09/05/2021 1414   TRIG 87 05/17/2022 1026   HDL 63 05/17/2022 1026   HDL 53 09/05/2021 1414   CHOLHDL 3.6 05/17/2022 1026   LDLCALC 142 (H) 05/17/2022 1026    CBC    Component Value Date/Time   WBC 5.6 05/17/2022 1026   RBC 4.65 05/17/2022 1026   HGB 14.2 05/17/2022 1026   HGB 14.0 09/05/2021 1414   HCT 42.7 05/17/2022 1026   HCT 42.1 09/05/2021 1414   PLT 408 (H) 05/17/2022 1026   PLT 402 09/05/2021 1414   MCV 91.8 05/17/2022 1026   MCV 90 09/05/2021 1414   MCH 30.5 05/17/2022 1026   MCHC 33.3 05/17/2022 1026   RDW 12.4 05/17/2022 1026   RDW 12.7 09/05/2021 1414    Hgb A1C Lab Results  Component Value Date   HGBA1C 5.4 05/17/2022       Assessment and Plan:  Chronic low back pain with bilateral sciatica:  X-ray lumbar spine ordered She has failed PT and injections in the past We will consider MRI pending x-ray results Referral to physiatry placed Rx for Tramadol 50 mg every 8 hours as needed for severe pain Rx for Flexeril 10 mg every 8 hours as needed for severe pain  Follow Up Instructions:    I discussed the assessment and treatment plan with the patient. The patient was provided an opportunity to ask questions and all were answered. The patient agreed with the plan and demonstrated an understanding of the instructions.   The patient was advised to call back  or seek an in-person evaluation if the symptoms worsen or if the condition fails to improve as anticipated.  RTC in 6 months for follow-up of chronic conditions  Tracy Reaper, NP

## 2022-06-08 NOTE — Patient Instructions (Signed)

## 2022-07-03 ENCOUNTER — Ambulatory Visit
Admission: RE | Admit: 2022-07-03 | Discharge: 2022-07-03 | Disposition: A | Discharge status: Home / Self Care | Payer: 59 | Source: Ambulatory Visit | Attending: Internal Medicine | Admitting: Internal Medicine

## 2022-07-03 DIAGNOSIS — Z1231 Encounter for screening mammogram for malignant neoplasm of breast: Secondary | ICD-10-CM | POA: Insufficient documentation

## 2022-08-06 ENCOUNTER — Other Ambulatory Visit: Payer: Self-pay | Admitting: Physical Medicine & Rehabilitation

## 2022-08-06 DIAGNOSIS — G8929 Other chronic pain: Secondary | ICD-10-CM

## 2022-08-14 ENCOUNTER — Ambulatory Visit
Admission: RE | Admit: 2022-08-14 | Discharge: 2022-08-14 | Disposition: A | Discharge status: Home / Self Care | Payer: 59 | Source: Ambulatory Visit | Attending: Physical Medicine & Rehabilitation | Admitting: Physical Medicine & Rehabilitation

## 2022-08-14 DIAGNOSIS — G8929 Other chronic pain: Secondary | ICD-10-CM | POA: Diagnosis present

## 2022-08-14 DIAGNOSIS — M5442 Lumbago with sciatica, left side: Secondary | ICD-10-CM | POA: Insufficient documentation

## 2022-08-22 ENCOUNTER — Other Ambulatory Visit: Payer: Self-pay

## 2022-08-22 DIAGNOSIS — E7800 Pure hypercholesterolemia, unspecified: Secondary | ICD-10-CM

## 2022-08-23 ENCOUNTER — Other Ambulatory Visit: Payer: 59

## 2022-11-09 DIAGNOSIS — Z419 Encounter for procedure for purposes other than remedying health state, unspecified: Secondary | ICD-10-CM | POA: Diagnosis not present

## 2022-11-16 ENCOUNTER — Ambulatory Visit: Payer: 59 | Admitting: Internal Medicine

## 2022-12-10 DIAGNOSIS — Z419 Encounter for procedure for purposes other than remedying health state, unspecified: Secondary | ICD-10-CM | POA: Diagnosis not present

## 2022-12-27 DIAGNOSIS — G8929 Other chronic pain: Secondary | ICD-10-CM | POA: Diagnosis not present

## 2022-12-27 DIAGNOSIS — M25552 Pain in left hip: Secondary | ICD-10-CM | POA: Diagnosis not present

## 2022-12-27 DIAGNOSIS — M48062 Spinal stenosis, lumbar region with neurogenic claudication: Secondary | ICD-10-CM | POA: Diagnosis not present

## 2022-12-27 DIAGNOSIS — M5442 Lumbago with sciatica, left side: Secondary | ICD-10-CM | POA: Diagnosis not present

## 2023-01-04 DIAGNOSIS — M170 Bilateral primary osteoarthritis of knee: Secondary | ICD-10-CM | POA: Diagnosis not present

## 2023-01-10 ENCOUNTER — Telehealth: Payer: Commercial Managed Care - PPO | Admitting: Physician Assistant

## 2023-01-10 DIAGNOSIS — M48062 Spinal stenosis, lumbar region with neurogenic claudication: Secondary | ICD-10-CM | POA: Diagnosis not present

## 2023-01-10 DIAGNOSIS — Z419 Encounter for procedure for purposes other than remedying health state, unspecified: Secondary | ICD-10-CM | POA: Diagnosis not present

## 2023-01-10 DIAGNOSIS — M5442 Lumbago with sciatica, left side: Secondary | ICD-10-CM | POA: Diagnosis not present

## 2023-01-10 NOTE — Progress Notes (Signed)
The patient no-showed for appointment despite this provider sending direct link, reaching out via phone with no response and waiting for at least 10 minutes from appointment time for patient to join. They will be marked as a NS for this appointment/time.  ? ?Keniel Ralston Cody Adlene Adduci, PA-C ? ? ? ?

## 2023-02-07 DIAGNOSIS — M25552 Pain in left hip: Secondary | ICD-10-CM | POA: Diagnosis not present

## 2023-02-07 DIAGNOSIS — G8929 Other chronic pain: Secondary | ICD-10-CM | POA: Diagnosis not present

## 2023-02-08 DIAGNOSIS — Z419 Encounter for procedure for purposes other than remedying health state, unspecified: Secondary | ICD-10-CM | POA: Diagnosis not present

## 2023-03-11 DIAGNOSIS — Z419 Encounter for procedure for purposes other than remedying health state, unspecified: Secondary | ICD-10-CM | POA: Diagnosis not present

## 2023-04-10 DIAGNOSIS — Z419 Encounter for procedure for purposes other than remedying health state, unspecified: Secondary | ICD-10-CM | POA: Diagnosis not present

## 2023-05-11 DIAGNOSIS — Z419 Encounter for procedure for purposes other than remedying health state, unspecified: Secondary | ICD-10-CM | POA: Diagnosis not present

## 2023-06-10 DIAGNOSIS — Z419 Encounter for procedure for purposes other than remedying health state, unspecified: Secondary | ICD-10-CM | POA: Diagnosis not present

## 2023-06-24 DIAGNOSIS — M5442 Lumbago with sciatica, left side: Secondary | ICD-10-CM | POA: Diagnosis not present

## 2023-06-24 DIAGNOSIS — Z7689 Persons encountering health services in other specified circumstances: Secondary | ICD-10-CM | POA: Diagnosis not present

## 2023-06-24 DIAGNOSIS — M47816 Spondylosis without myelopathy or radiculopathy, lumbar region: Secondary | ICD-10-CM | POA: Diagnosis not present

## 2023-06-24 DIAGNOSIS — M25552 Pain in left hip: Secondary | ICD-10-CM | POA: Diagnosis not present

## 2023-06-24 DIAGNOSIS — G8929 Other chronic pain: Secondary | ICD-10-CM | POA: Diagnosis not present

## 2023-06-25 ENCOUNTER — Encounter: Payer: Self-pay | Admitting: Internal Medicine

## 2023-06-25 ENCOUNTER — Ambulatory Visit (INDEPENDENT_AMBULATORY_CARE_PROVIDER_SITE_OTHER): Payer: Medicaid Other | Admitting: Internal Medicine

## 2023-06-25 VITALS — BP 126/82 | HR 78 | Temp 95.9°F | Wt 244.0 lb

## 2023-06-25 DIAGNOSIS — F32A Depression, unspecified: Secondary | ICD-10-CM

## 2023-06-25 DIAGNOSIS — R7309 Other abnormal glucose: Secondary | ICD-10-CM

## 2023-06-25 DIAGNOSIS — Z6841 Body Mass Index (BMI) 40.0 and over, adult: Secondary | ICD-10-CM

## 2023-06-25 DIAGNOSIS — E7800 Pure hypercholesterolemia, unspecified: Secondary | ICD-10-CM

## 2023-06-25 DIAGNOSIS — M159 Polyosteoarthritis, unspecified: Secondary | ICD-10-CM

## 2023-06-25 DIAGNOSIS — D75839 Thrombocytosis, unspecified: Secondary | ICD-10-CM

## 2023-06-25 DIAGNOSIS — K219 Gastro-esophageal reflux disease without esophagitis: Secondary | ICD-10-CM | POA: Diagnosis not present

## 2023-06-25 DIAGNOSIS — Z1211 Encounter for screening for malignant neoplasm of colon: Secondary | ICD-10-CM | POA: Diagnosis not present

## 2023-06-25 DIAGNOSIS — Z7689 Persons encountering health services in other specified circumstances: Secondary | ICD-10-CM | POA: Diagnosis not present

## 2023-06-25 DIAGNOSIS — F419 Anxiety disorder, unspecified: Secondary | ICD-10-CM | POA: Diagnosis not present

## 2023-06-25 LAB — CBC
MCHC: 33 g/dL (ref 32.0–36.0)
Platelets: 398 10*3/uL (ref 140–400)
RDW: 12.1 % (ref 11.0–15.0)

## 2023-06-25 MED ORDER — OMEPRAZOLE 20 MG PO CPDR: 20.0000 mg | DELAYED_RELEASE_CAPSULE | Freq: Every day | ORAL | 1 refills | Status: AC | PRN

## 2023-06-25 MED ORDER — CYCLOBENZAPRINE HCL 10 MG PO TABS: 10.0000 mg | ORAL_TABLET | Freq: Three times a day (TID) | ORAL | 0 refills | Status: DC | PRN

## 2023-06-25 MED ORDER — ATORVASTATIN CALCIUM 10 MG PO TABS: 10.0000 mg | ORAL_TABLET | Freq: Every day | ORAL | 1 refills | Status: DC

## 2023-06-25 MED ORDER — ESCITALOPRAM OXALATE 20 MG PO TABS: 20.0000 mg | ORAL_TABLET | Freq: Every day | ORAL | 1 refills | Status: DC

## 2023-06-25 NOTE — Assessment & Plan Note (Signed)
Avoid foods that trigger reflux Encouraged also as this can help reduce reflux symptoms Continue omeprazole as needed

## 2023-06-25 NOTE — Assessment & Plan Note (Signed)
 CBC today.  

## 2023-06-25 NOTE — Assessment & Plan Note (Signed)
C-Met and lipid profile today Encouraged her to consume a low-fat diet Will likely need to restart atorvastatin based on labs

## 2023-06-25 NOTE — Progress Notes (Signed)
Subjective:    Patient ID: Tracy Kane, female    DOB: [birth year], 52 y.o.   MRN: [id number]  HPI  Patient presents to the clinic today for follow-up of chronic conditions.  Anxiety and depression: Chronic, managed on escitalopram but she reports she ran out 1 month ago. She has been under a lot of stress lately.  She is not currently seeing a therapist.  She denies SI/HI.  GERD: She is not sure what triggers this, maybe eating and laying down.  She takes omeprazole only as needed with good relief of symptoms.  There is no upper GI on file.  OA: Mainly in her back and hips.  She takes cyclobenzaprine as prescribed.  She follows with [facility].  HLD: Her last LDL was 142, triglycerides 81, 05/2022.  She ran out of atorvastatin.  She has been trying to consume a low-fat diet.  Thrombocytosis: Her last platelet count was 408, 05/2022.  She does not follow with hematology.  Review of Systems     Past Medical History:  Diagnosis Date   Anxiety    Arthritis    Depression    Hyperlipidemia     Current Outpatient Medications  Medication Sig Dispense Refill   atorvastatin (LIPITOR) 10 MG tablet Take 1 tablet (10 mg total) by mouth daily. 30 tablet 2   cyclobenzaprine (FLEXERIL) 10 MG tablet Take 1 tablet (10 mg total) by mouth 3 (three) times daily as needed for muscle spasms. 15 tablet 0   escitalopram (LEXAPRO) 20 MG tablet Take 1 tablet (20 mg total) by mouth daily. 90 tablet 1   omeprazole (PRILOSEC) 20 MG capsule Take 1 capsule (20 mg total) by mouth daily as needed. 90 capsule 1   No current facility-administered medications for this visit.    No Known Allergies  Family History  Problem Relation Age of Onset   Breast cancer Paternal Grandmother     Social History   Socioeconomic History   Marital status: Single    Spouse name: Not on file   Number of children: Not on file   Years of education: Not on file   Highest education level: Not on file  Occupational  History   Not on file  Tobacco Use   Smoking status: Never   Smokeless tobacco: Never  Vaping Use   Vaping status: Never Used  Substance and Sexual Activity   Alcohol use: Yes    Comment: occasionally   Drug use: No   Sexual activity: Not Currently  Other Topics Concern   Not on file  Social History Narrative   Not on file   Social Determinants of Health   Financial Resource Strain: Not on file  Food Insecurity: Not on file  Transportation Needs: Not on file  Physical Activity: Not on file  Stress: Not on file  Social Connections: Not on file  Intimate Partner Violence: Not on file     Constitutional: Denies fever, malaise, fatigue, headache or abrupt weight changes.  HEENT: Denies eye pain, eye redness, ear pain, ringing in the ears, wax buildup, runny nose, nasal congestion, bloody nose, or sore throat. Respiratory: Denies difficulty breathing, shortness of breath, cough or sputum production.   Cardiovascular: Denies chest pain, chest tightness, palpitations or swelling in the hands or feet.  Gastrointestinal: Patient reports intermittent reflux.  Denies abdominal pain, bloating, constipation, diarrhea or blood in the stool.  GU: Denies urgency, frequency, pain with urination, burning sensation, blood in urine, odor or discharge. Musculoskeletal: Patient reports chronic  back and hip pain.  Denies decrease in range of motion, difficulty with gait, muscle pain or joint swelling.  Skin: Denies redness, rashes, lesions or ulcercations.  Neurological: Denies dizziness, difficulty with memory, difficulty with speech or problems with balance and coordination.  Psych: Patient has a history of anxiety and depression.  Denies SI/HI.  No other specific complaints in a complete review of systems (except as listed in HPI above).  Objective:   Physical Exam  BP 126/82 (BP Location: Left Arm, Patient Position: Sitting, Cuff Size: Large)   Pulse 78   Temp (!) 95.9 F (35.5 C)  (Temporal)   Wt 244 lb (110.7 kg)   SpO2 97%   BMI 44.63 kg/m   Wt Readings from Last 3 Encounters:  05/17/22 250 lb (113.4 kg)  09/04/21 243 lb 12.8 oz (110.6 kg)  06/05/21 238 lb (108 kg)    General: Appears her stated age, obese, in NAD. Skin: Warm, dry and intact. No rashes, lesions or ulcerations noted. HEENT: Head: normal shape and size; Eyes: sclera white, no icterus, conjunctiva pink, PERRLA and EOMs intact;  Cardiovascular: Normal rate and rhythm. S1,S2 noted.  No murmur, rubs or gallops noted. No JVD or BLE edema. No carotid bruits noted. Pulmonary/Chest: Normal effort and positive vesicular breath sounds. No respiratory distress. No wheezes, rales or ronchi noted.  Abdomen: Soft and nontender. Normal bowel sounds.  Musculoskeletal: No bony tenderness noted over the lumbar spine.  Strength 5/5 BLE. No difficulty with gait.  Neurological: Alert and oriented. Coordination normal.  Psychiatric: Mood and affect normal. Behavior is normal. Judgment and thought content normal.     BMET    Component Value Date/Time   NA 142 05/17/2022 1026   NA 139 09/05/2021 1414   K 4.0 05/17/2022 1026   CL 107 05/17/2022 1026   CO2 26 05/17/2022 1026   GLUCOSE 78 05/17/2022 1026   BUN 9 05/17/2022 1026   BUN 6 09/05/2021 1414   CREATININE 0.64 05/17/2022 1026   CALCIUM 9.4 05/17/2022 1026   GFRNONAA >60 05/18/2017 1952   GFRAA >60 05/18/2017 1952    Lipid Panel     Component Value Date/Time   CHOL 224 (H) 05/17/2022 1026   CHOL 214 (H) 09/05/2021 1414   TRIG 87 05/17/2022 1026   HDL 63 05/17/2022 1026   HDL 53 09/05/2021 1414   CHOLHDL 3.6 05/17/2022 1026   LDLCALC 142 (H) 05/17/2022 1026    CBC    Component Value Date/Time   WBC 5.6 05/17/2022 1026   RBC 4.65 05/17/2022 1026   HGB 14.2 05/17/2022 1026   HGB 14.0 09/05/2021 1414   HCT 42.7 05/17/2022 1026   HCT 42.1 09/05/2021 1414   PLT 408 (H) 05/17/2022 1026   PLT 402 09/05/2021 1414   MCV 91.8 05/17/2022 1026    MCV 90 09/05/2021 1414   MCH 30.5 05/17/2022 1026   MCHC 33.3 05/17/2022 1026   RDW 12.4 05/17/2022 1026   RDW 12.7 09/05/2021 1414    Hgb A1C Lab Results  Component Value Date   HGBA1C 5.4 05/17/2022            Assessment & Plan:      RTC in 6 months for annual exam [name], NP

## 2023-06-25 NOTE — Assessment & Plan Note (Signed)
Encourage diet and exercise for weight loss 

## 2023-06-25 NOTE — Assessment & Plan Note (Signed)
Deteriorated off escitalopram, refill today Support offered

## 2023-06-25 NOTE — Patient Instructions (Signed)

## 2023-06-25 NOTE — Assessment & Plan Note (Signed)
Encouraged regular stretching and exercise for weight loss as this can help reduce joint pain Continues cyclobenzaprine She will continue to follow with orthopedics

## 2023-06-26 LAB — COMPLETE METABOLIC PANEL WITH GFR
AG Ratio: 1.3 (calc) (ref 1.0–2.5)
ALT: 11 U/L (ref 6–29)
AST: 16 U/L (ref 10–35)
Albumin: 4.2 g/dL (ref 3.6–5.1)
Alkaline phosphatase (APISO): 55 U/L (ref 37–153)
BUN: 14 mg/dL (ref 7–25)
CO2: 27 mmol/L (ref 20–32)
Calcium: 9.2 mg/dL (ref 8.6–10.4)
Chloride: 106 mmol/L (ref 98–110)
Creat: 0.64 mg/dL (ref 0.50–1.03)
Globulin: 3.2 g/dL (calc) (ref 1.9–3.7)
Glucose, Bld: 91 mg/dL (ref 65–139)
Potassium: 3.9 mmol/L (ref 3.5–5.3)
Sodium: 142 mmol/L (ref 135–146)
Total Bilirubin: 0.4 mg/dL (ref 0.2–1.2)
Total Protein: 7.4 g/dL (ref 6.1–8.1)
eGFR: 108 mL/min/{1.73_m2} (ref >=60)

## 2023-06-26 LAB — CBC
HCT: 40.6 % (ref 35.0–45.0)
Hemoglobin: 13.4 g/dL (ref 11.7–15.5)
MCH: 30.5 pg (ref 27.0–33.0)
MCV: 92.5 fL (ref 80.0–100.0)
MPV: 8.9 fL (ref 7.5–12.5)
RBC: 4.39 10*6/uL (ref 3.80–5.10)
WBC: 5.8 10*3/uL (ref 3.8–10.8)

## 2023-06-26 LAB — LIPID PANEL
Cholesterol: 218 mg/dL — ABNORMAL HIGH (ref <200)
HDL: 54 mg/dL (ref >=50)
LDL Cholesterol (Calc): 141 mg/dL (calc) — ABNORMAL HIGH
Non-HDL Cholesterol (Calc): 164 mg/dL (calc) — ABNORMAL HIGH (ref <130)
Total CHOL/HDL Ratio: 4 (calc) (ref <5.0)
Triglycerides: 112 mg/dL (ref <150)

## 2023-06-26 LAB — HEMOGLOBIN A1C
Hgb A1c MFr Bld: 5.5 % of total Hgb (ref <5.7)
Mean Plasma Glucose: 111 mg/dL
eAG (mmol/L): 6.2 mmol/L

## 2023-06-26 NOTE — Addendum Note (Signed)
Addended by: Lorre Munroe on: 06/26/2023 07:59 AM   Modules accepted: Orders

## 2023-06-27 ENCOUNTER — Telehealth: Payer: Self-pay

## 2023-06-27 NOTE — Telephone Encounter (Signed)
PT LEFT MESSAGE TO SCHEDULE COLONOSCOPY PLEASE RETURN CALL

## 2023-07-01 NOTE — Telephone Encounter (Signed)
Spoken to patient and she would like a call back after 08/13/2023 because she is starting a new job. I have set a reminder for me to call patient.

## 2023-07-08 DIAGNOSIS — M47816 Spondylosis without myelopathy or radiculopathy, lumbar region: Secondary | ICD-10-CM | POA: Diagnosis not present

## 2023-07-08 DIAGNOSIS — Z7689 Persons encountering health services in other specified circumstances: Secondary | ICD-10-CM | POA: Diagnosis not present

## 2023-07-11 DIAGNOSIS — Z419 Encounter for procedure for purposes other than remedying health state, unspecified: Secondary | ICD-10-CM | POA: Diagnosis not present

## 2023-07-11 DIAGNOSIS — Z7689 Persons encountering health services in other specified circumstances: Secondary | ICD-10-CM | POA: Diagnosis not present

## 2023-07-11 DIAGNOSIS — R635 Abnormal weight gain: Secondary | ICD-10-CM | POA: Diagnosis not present

## 2023-07-12 DIAGNOSIS — M25552 Pain in left hip: Secondary | ICD-10-CM | POA: Diagnosis not present

## 2023-07-12 DIAGNOSIS — Z7689 Persons encountering health services in other specified circumstances: Secondary | ICD-10-CM | POA: Diagnosis not present

## 2023-07-12 DIAGNOSIS — G8929 Other chronic pain: Secondary | ICD-10-CM | POA: Diagnosis not present

## 2023-07-26 DIAGNOSIS — M25552 Pain in left hip: Secondary | ICD-10-CM | POA: Diagnosis not present

## 2023-07-26 DIAGNOSIS — M5442 Lumbago with sciatica, left side: Secondary | ICD-10-CM | POA: Diagnosis not present

## 2023-07-26 DIAGNOSIS — M47816 Spondylosis without myelopathy or radiculopathy, lumbar region: Secondary | ICD-10-CM | POA: Diagnosis not present

## 2023-07-26 DIAGNOSIS — G8929 Other chronic pain: Secondary | ICD-10-CM | POA: Diagnosis not present

## 2023-07-26 DIAGNOSIS — Z7689 Persons encountering health services in other specified circumstances: Secondary | ICD-10-CM | POA: Diagnosis not present

## 2023-07-27 DIAGNOSIS — Z7689 Persons encountering health services in other specified circumstances: Secondary | ICD-10-CM | POA: Diagnosis not present

## 2023-07-29 DIAGNOSIS — Z7689 Persons encountering health services in other specified circumstances: Secondary | ICD-10-CM | POA: Diagnosis not present

## 2023-07-30 DIAGNOSIS — Z7689 Persons encountering health services in other specified circumstances: Secondary | ICD-10-CM | POA: Diagnosis not present

## 2023-07-31 DIAGNOSIS — Z7689 Persons encountering health services in other specified circumstances: Secondary | ICD-10-CM | POA: Diagnosis not present

## 2023-08-01 DIAGNOSIS — Z7689 Persons encountering health services in other specified circumstances: Secondary | ICD-10-CM | POA: Diagnosis not present

## 2023-08-02 DIAGNOSIS — Z7689 Persons encountering health services in other specified circumstances: Secondary | ICD-10-CM | POA: Diagnosis not present

## 2023-08-05 DIAGNOSIS — Z7689 Persons encountering health services in other specified circumstances: Secondary | ICD-10-CM | POA: Diagnosis not present

## 2023-08-07 DIAGNOSIS — Z7689 Persons encountering health services in other specified circumstances: Secondary | ICD-10-CM | POA: Diagnosis not present

## 2023-08-08 DIAGNOSIS — Z7689 Persons encountering health services in other specified circumstances: Secondary | ICD-10-CM | POA: Diagnosis not present

## 2023-08-09 DIAGNOSIS — Z7689 Persons encountering health services in other specified circumstances: Secondary | ICD-10-CM | POA: Diagnosis not present

## 2023-08-11 DIAGNOSIS — Z419 Encounter for procedure for purposes other than remedying health state, unspecified: Secondary | ICD-10-CM | POA: Diagnosis not present

## 2023-08-13 DIAGNOSIS — Z7689 Persons encountering health services in other specified circumstances: Secondary | ICD-10-CM | POA: Diagnosis not present

## 2023-08-14 DIAGNOSIS — Z7689 Persons encountering health services in other specified circumstances: Secondary | ICD-10-CM | POA: Diagnosis not present

## 2023-08-15 ENCOUNTER — Encounter: Payer: Self-pay | Admitting: *Deleted

## 2023-08-15 DIAGNOSIS — Z7689 Persons encountering health services in other specified circumstances: Secondary | ICD-10-CM | POA: Diagnosis not present

## 2023-08-16 DIAGNOSIS — Z7689 Persons encountering health services in other specified circumstances: Secondary | ICD-10-CM | POA: Diagnosis not present

## 2023-08-19 DIAGNOSIS — Z7689 Persons encountering health services in other specified circumstances: Secondary | ICD-10-CM | POA: Diagnosis not present

## 2023-08-20 DIAGNOSIS — Z7689 Persons encountering health services in other specified circumstances: Secondary | ICD-10-CM | POA: Diagnosis not present

## 2023-08-21 DIAGNOSIS — Z7689 Persons encountering health services in other specified circumstances: Secondary | ICD-10-CM | POA: Diagnosis not present

## 2023-08-22 DIAGNOSIS — Z7689 Persons encountering health services in other specified circumstances: Secondary | ICD-10-CM | POA: Diagnosis not present

## 2023-08-22 DIAGNOSIS — Z6841 Body Mass Index (BMI) 40.0 and over, adult: Secondary | ICD-10-CM | POA: Diagnosis not present

## 2023-08-22 DIAGNOSIS — M47816 Spondylosis without myelopathy or radiculopathy, lumbar region: Secondary | ICD-10-CM | POA: Diagnosis not present

## 2023-08-22 DIAGNOSIS — M170 Bilateral primary osteoarthritis of knee: Secondary | ICD-10-CM | POA: Diagnosis not present

## 2023-08-23 DIAGNOSIS — Z7689 Persons encountering health services in other specified circumstances: Secondary | ICD-10-CM | POA: Diagnosis not present

## 2023-08-26 DIAGNOSIS — Z7689 Persons encountering health services in other specified circumstances: Secondary | ICD-10-CM | POA: Diagnosis not present

## 2023-08-27 DIAGNOSIS — Z7689 Persons encountering health services in other specified circumstances: Secondary | ICD-10-CM | POA: Diagnosis not present

## 2023-08-28 DIAGNOSIS — Z7689 Persons encountering health services in other specified circumstances: Secondary | ICD-10-CM | POA: Diagnosis not present

## 2023-08-29 DIAGNOSIS — Z7689 Persons encountering health services in other specified circumstances: Secondary | ICD-10-CM | POA: Diagnosis not present

## 2023-08-30 DIAGNOSIS — Z7689 Persons encountering health services in other specified circumstances: Secondary | ICD-10-CM | POA: Diagnosis not present

## 2023-09-02 DIAGNOSIS — Z7689 Persons encountering health services in other specified circumstances: Secondary | ICD-10-CM | POA: Diagnosis not present

## 2023-09-03 DIAGNOSIS — Z7689 Persons encountering health services in other specified circumstances: Secondary | ICD-10-CM | POA: Diagnosis not present

## 2023-09-04 DIAGNOSIS — Z7689 Persons encountering health services in other specified circumstances: Secondary | ICD-10-CM | POA: Diagnosis not present

## 2023-09-09 DIAGNOSIS — Z7689 Persons encountering health services in other specified circumstances: Secondary | ICD-10-CM | POA: Diagnosis not present

## 2023-09-10 DIAGNOSIS — Z7689 Persons encountering health services in other specified circumstances: Secondary | ICD-10-CM | POA: Diagnosis not present

## 2023-09-10 DIAGNOSIS — Z419 Encounter for procedure for purposes other than remedying health state, unspecified: Secondary | ICD-10-CM | POA: Diagnosis not present

## 2023-09-11 DIAGNOSIS — Z7689 Persons encountering health services in other specified circumstances: Secondary | ICD-10-CM | POA: Diagnosis not present

## 2023-09-12 DIAGNOSIS — Z7689 Persons encountering health services in other specified circumstances: Secondary | ICD-10-CM | POA: Diagnosis not present

## 2023-09-17 DIAGNOSIS — Z7689 Persons encountering health services in other specified circumstances: Secondary | ICD-10-CM | POA: Diagnosis not present

## 2023-09-18 DIAGNOSIS — Z7689 Persons encountering health services in other specified circumstances: Secondary | ICD-10-CM | POA: Diagnosis not present

## 2023-09-19 DIAGNOSIS — Z7689 Persons encountering health services in other specified circumstances: Secondary | ICD-10-CM | POA: Diagnosis not present

## 2023-09-20 DIAGNOSIS — Z7689 Persons encountering health services in other specified circumstances: Secondary | ICD-10-CM | POA: Diagnosis not present

## 2023-09-23 DIAGNOSIS — Z7689 Persons encountering health services in other specified circumstances: Secondary | ICD-10-CM | POA: Diagnosis not present

## 2023-09-24 DIAGNOSIS — Z7689 Persons encountering health services in other specified circumstances: Secondary | ICD-10-CM | POA: Diagnosis not present

## 2023-09-25 DIAGNOSIS — Z7689 Persons encountering health services in other specified circumstances: Secondary | ICD-10-CM | POA: Diagnosis not present

## 2023-09-27 DIAGNOSIS — Z7689 Persons encountering health services in other specified circumstances: Secondary | ICD-10-CM | POA: Diagnosis not present

## 2023-10-01 DIAGNOSIS — Z7689 Persons encountering health services in other specified circumstances: Secondary | ICD-10-CM | POA: Diagnosis not present

## 2023-10-02 DIAGNOSIS — Z7689 Persons encountering health services in other specified circumstances: Secondary | ICD-10-CM | POA: Diagnosis not present

## 2023-10-03 DIAGNOSIS — Z7689 Persons encountering health services in other specified circumstances: Secondary | ICD-10-CM | POA: Diagnosis not present

## 2023-10-04 DIAGNOSIS — Z7689 Persons encountering health services in other specified circumstances: Secondary | ICD-10-CM | POA: Diagnosis not present

## 2023-10-08 DIAGNOSIS — Z7689 Persons encountering health services in other specified circumstances: Secondary | ICD-10-CM | POA: Diagnosis not present

## 2023-10-09 DIAGNOSIS — Z7689 Persons encountering health services in other specified circumstances: Secondary | ICD-10-CM | POA: Diagnosis not present

## 2023-10-10 DIAGNOSIS — Z7689 Persons encountering health services in other specified circumstances: Secondary | ICD-10-CM | POA: Diagnosis not present

## 2023-10-11 DIAGNOSIS — Z419 Encounter for procedure for purposes other than remedying health state, unspecified: Secondary | ICD-10-CM | POA: Diagnosis not present

## 2023-10-11 DIAGNOSIS — Z7689 Persons encountering health services in other specified circumstances: Secondary | ICD-10-CM | POA: Diagnosis not present

## 2023-10-14 DIAGNOSIS — Z7689 Persons encountering health services in other specified circumstances: Secondary | ICD-10-CM | POA: Diagnosis not present

## 2023-10-16 DIAGNOSIS — Z7689 Persons encountering health services in other specified circumstances: Secondary | ICD-10-CM | POA: Diagnosis not present

## 2023-10-17 DIAGNOSIS — Z7689 Persons encountering health services in other specified circumstances: Secondary | ICD-10-CM | POA: Diagnosis not present

## 2023-10-18 DIAGNOSIS — Z7689 Persons encountering health services in other specified circumstances: Secondary | ICD-10-CM | POA: Diagnosis not present

## 2023-10-21 DIAGNOSIS — Z7689 Persons encountering health services in other specified circumstances: Secondary | ICD-10-CM | POA: Diagnosis not present

## 2023-10-22 DIAGNOSIS — Z7689 Persons encountering health services in other specified circumstances: Secondary | ICD-10-CM | POA: Diagnosis not present

## 2023-10-23 DIAGNOSIS — Z7689 Persons encountering health services in other specified circumstances: Secondary | ICD-10-CM | POA: Diagnosis not present

## 2023-10-24 DIAGNOSIS — Z7689 Persons encountering health services in other specified circumstances: Secondary | ICD-10-CM | POA: Diagnosis not present

## 2023-10-28 DIAGNOSIS — Z7689 Persons encountering health services in other specified circumstances: Secondary | ICD-10-CM | POA: Diagnosis not present

## 2023-10-29 DIAGNOSIS — Z7689 Persons encountering health services in other specified circumstances: Secondary | ICD-10-CM | POA: Diagnosis not present

## 2023-10-30 DIAGNOSIS — Z7689 Persons encountering health services in other specified circumstances: Secondary | ICD-10-CM | POA: Diagnosis not present

## 2023-10-31 DIAGNOSIS — Z7689 Persons encountering health services in other specified circumstances: Secondary | ICD-10-CM | POA: Diagnosis not present

## 2023-11-01 DIAGNOSIS — Z7689 Persons encountering health services in other specified circumstances: Secondary | ICD-10-CM | POA: Diagnosis not present

## 2023-11-04 DIAGNOSIS — Z7689 Persons encountering health services in other specified circumstances: Secondary | ICD-10-CM | POA: Diagnosis not present

## 2023-11-05 DIAGNOSIS — Z7689 Persons encountering health services in other specified circumstances: Secondary | ICD-10-CM | POA: Diagnosis not present

## 2023-11-06 DIAGNOSIS — Z7689 Persons encountering health services in other specified circumstances: Secondary | ICD-10-CM | POA: Diagnosis not present

## 2023-11-10 DIAGNOSIS — Z419 Encounter for procedure for purposes other than remedying health state, unspecified: Secondary | ICD-10-CM | POA: Diagnosis not present

## 2023-11-14 DIAGNOSIS — Z7689 Persons encountering health services in other specified circumstances: Secondary | ICD-10-CM | POA: Diagnosis not present

## 2023-11-22 DIAGNOSIS — Z7689 Persons encountering health services in other specified circumstances: Secondary | ICD-10-CM | POA: Diagnosis not present

## 2023-11-25 DIAGNOSIS — Z7689 Persons encountering health services in other specified circumstances: Secondary | ICD-10-CM | POA: Diagnosis not present

## 2023-11-26 DIAGNOSIS — Z7689 Persons encountering health services in other specified circumstances: Secondary | ICD-10-CM | POA: Diagnosis not present

## 2023-11-28 DIAGNOSIS — Z7689 Persons encountering health services in other specified circumstances: Secondary | ICD-10-CM | POA: Diagnosis not present

## 2023-12-02 DIAGNOSIS — Z7689 Persons encountering health services in other specified circumstances: Secondary | ICD-10-CM | POA: Diagnosis not present

## 2023-12-05 DIAGNOSIS — Z7689 Persons encountering health services in other specified circumstances: Secondary | ICD-10-CM | POA: Diagnosis not present

## 2023-12-11 DIAGNOSIS — Z419 Encounter for procedure for purposes other than remedying health state, unspecified: Secondary | ICD-10-CM | POA: Diagnosis not present

## 2023-12-26 ENCOUNTER — Encounter: Payer: Medicaid Other | Admitting: Internal Medicine

## 2023-12-26 NOTE — Progress Notes (Deleted)
Subjective:    Patient ID: Tracy Kane, female    DOB: 1970/12/19, 53 y.o.   MRN: [id number]  HPI  Patient presents to clinic today for annual exam.   Flu: 08/2021 Tetanus: unsure COVID: Pfizer x 2 Pap smear: 05/2022 Mammogram: 06/2022 Colon screening: Never Vision screening: annually Dentist: biannually  Diet: She does eat lean meat. She consumes fruits and veggies. She tries to avoid fried foods. She drinks mostly water or green tea. Exercise: Walking   Review of Systems     Past Medical History:  Diagnosis Date   Anxiety    Arthritis    Depression    Hyperlipidemia     Current Outpatient Medications  Medication Sig Dispense Refill   atorvastatin (LIPITOR) 10 MG tablet Take 1 tablet (10 mg total) by mouth daily. 90 tablet 1   cyclobenzaprine (FLEXERIL) 10 MG tablet Take 1 tablet (10 mg total) by mouth 3 (three) times daily as needed for muscle spasms. 15 tablet 0   escitalopram (LEXAPRO) 20 MG tablet Take 1 tablet (20 mg total) by mouth daily. 90 tablet 1   omeprazole (PRILOSEC) 20 MG capsule Take 1 capsule (20 mg total) by mouth daily as needed. 90 capsule 1   No current facility-administered medications for this visit.    No Known Allergies  Family History  Problem Relation Age of Onset   Breast cancer Paternal Grandmother     Social History   Socioeconomic History   Marital status: Single    Spouse name: Not on file   Number of children: Not on file   Years of education: Not on file   Highest education level: Not on file  Occupational History   Not on file  Tobacco Use   Smoking status: Never   Smokeless tobacco: Never  Vaping Use   Vaping status: Never Used  Substance and Sexual Activity   Alcohol use: Yes    Comment: occasionally   Drug use: No   Sexual activity: Not Currently  Other Topics Concern   Not on file  Social History Narrative   Not on file   Social Drivers of Health   Financial Resource Strain: Not on file  Food  Insecurity: Not on file  Transportation Needs: Not on file  Physical Activity: Not on file  Stress: Not on file  Social Connections: Not on file  Intimate Partner Violence: Not on file     Constitutional: Denies fever, malaise, fatigue, headache or abrupt weight changes.  HEENT: Denies eye pain, eye redness, ear pain, ringing in the ears, wax buildup, runny nose, nasal congestion, bloody nose, or sore throat. Respiratory: Denies difficulty breathing, shortness of breath, cough or sputum production.   Cardiovascular: Denies chest pain, chest tightness, palpitations or swelling in the hands or feet.  Gastrointestinal: Denies abdominal pain, bloating, constipation, diarrhea or blood in the stool.  GU: Denies urgency, frequency, pain with urination, burning sensation, blood in urine, odor or discharge. Musculoskeletal: Patient reports joint pain.  Denies decrease in range of motion, difficulty with gait, muscle pain or joint swelling.  Skin: Pt reports mass of abdomen and upper back. Denies redness, rashes, or ulcercations.  Neurological: Denies dizziness, difficulty with memory, difficulty with speech or problems with balance and coordination.  Psych: Patient has a history of anxiety and depression.  Denies SI/HI.  No other specific complaints in a complete review of systems (except as listed in HPI above).  Objective:   Physical Exam  There were no vitals taken for  this visit.  Wt Readings from Last 3 Encounters:  06/25/23 244 lb (110.7 kg)  05/17/22 250 lb (113.4 kg)  09/04/21 243 lb 12.8 oz (110.6 kg)    General: Appears her stated age, obese, in NAD. Skin: Warm, dry and intact. No rashes noted. Mass noted  of RLQ, ? Hernia vs lipoma. Enlarged fat pad noted at base of neck. HEENT: Head: normal shape and size; Eyes: sclera white, no icterus, conjunctiva pink, PERRLA and EOMs intact;  Neck:  Neck supple, trachea midline. No masses, lumps or thyromegaly present.  Cardiovascular:  Normal rate and rhythm. S1,S2 noted.  No murmur, rubs or gallops noted. No JVD or BLE edema.  Pulmonary/Chest: Normal effort and positive vesicular breath sounds. No respiratory distress. No wheezes, rales or ronchi noted.  Abdomen: Soft and nontender. Normal bowel sounds.  Pelvic: Normal female anatomy. Cervix without mass or lesion. No CMT. Adnexa non palpable. Musculoskeletal: Strength 5/5 BUE/BLE. No signs of joint swelling. No muscular tender spots noted. No difficulty with gait.  Neurological: Alert and oriented. Cranial nerves II-XII grossly intact. Coordination normal.  Psychiatric: Mood and affect normal. Behavior is normal. Judgment and thought content normal.     BMET    Component Value Date/Time   NA 142 06/25/2023 0845   NA 139 09/05/2021 1414   K 3.9 06/25/2023 0845   CL 106 06/25/2023 0845   CO2 27 06/25/2023 0845   GLUCOSE 91 06/25/2023 0845   BUN 14 06/25/2023 0845   BUN 6 09/05/2021 1414   CREATININE 0.64 06/25/2023 0845   CALCIUM 9.2 06/25/2023 0845   GFRNONAA >60 05/18/2017 1952   GFRAA >60 05/18/2017 1952    Lipid Panel     Component Value Date/Time   CHOL 218 (H) 06/25/2023 0845   CHOL 214 (H) 09/05/2021 1414   TRIG 112 06/25/2023 0845   HDL 54 06/25/2023 0845   HDL 53 09/05/2021 1414   CHOLHDL 4.0 06/25/2023 0845   LDLCALC 141 (H) 06/25/2023 0845    CBC    Component Value Date/Time   WBC 5.8 06/25/2023 0845   RBC 4.39 06/25/2023 0845   HGB 13.4 06/25/2023 0845   HGB 14.0 09/05/2021 1414   HCT 40.6 06/25/2023 0845   HCT 42.1 09/05/2021 1414   PLT 398 06/25/2023 0845   PLT 402 09/05/2021 1414   MCV 92.5 06/25/2023 0845   MCV 90 09/05/2021 1414   MCH 30.5 06/25/2023 0845   MCHC 33.0 06/25/2023 0845   RDW 12.1 06/25/2023 0845   RDW 12.7 09/05/2021 1414    Hgb A1C Lab Results  Component Value Date   HGBA1C 5.5 06/25/2023           Assessment & Plan:   Preventative Health Maintenance:  Encouraged her to get a flu shot in the  fall Tetanus UTD Encouraged her to get her COVID-vaccine Pap smear today with STD screening Mammogram ordered-she will call to schedule Referral to GI for screening colonoscopy Encouraged her to consume a balanced diet and exercise regimen Advised her to see an eye doctor and dentist annually We will check CBC, c-Met, lipid, A1c today   RTC in 6 months, follow-up chronic conditions [name], NP

## 2024-01-11 DIAGNOSIS — Z419 Encounter for procedure for purposes other than remedying health state, unspecified: Secondary | ICD-10-CM | POA: Diagnosis not present

## 2024-01-13 ENCOUNTER — Encounter: Payer: Self-pay | Admitting: Internal Medicine

## 2024-01-13 ENCOUNTER — Telehealth: Payer: Medicaid Other | Admitting: Internal Medicine

## 2024-01-13 DIAGNOSIS — R42 Dizziness and giddiness: Secondary | ICD-10-CM

## 2024-01-13 DIAGNOSIS — R030 Elevated blood-pressure reading, without diagnosis of hypertension: Secondary | ICD-10-CM | POA: Diagnosis not present

## 2024-01-13 DIAGNOSIS — R519 Headache, unspecified: Secondary | ICD-10-CM | POA: Diagnosis not present

## 2024-01-13 MED ORDER — MECLIZINE HCL 25 MG PO TABS: 25.0000 mg | ORAL_TABLET | Freq: Three times a day (TID) | ORAL | 0 refills | Status: DC | PRN

## 2024-01-13 NOTE — Patient Instructions (Signed)
 Hypertension, Adult High blood pressure (hypertension) is when the force of blood pumping through the arteries is too strong. The arteries are the blood vessels that carry blood from the heart throughout the body. Hypertension forces the heart to work harder to pump blood and may cause arteries to become narrow or stiff. Untreated or uncontrolled hypertension can lead to a heart attack, heart failure, a stroke, kidney disease, and other problems. A blood pressure reading consists of a higher number over a lower number. Ideally, your blood pressure should be below 120/80. The first ("top") number is called the systolic pressure. It is a measure of the pressure in your arteries as your heart beats. The second ("bottom") number is called the diastolic pressure. It is a measure of the pressure in your arteries as the heart relaxes. What are the causes? The exact cause of this condition is not known. There are some conditions that result in high blood pressure. What increases the risk? Certain factors may make you more likely to develop high blood pressure. Some of these risk factors are under your control, including: Smoking. Not getting enough exercise or physical activity. Being overweight. Having too much fat, sugar, calories, or salt (sodium) in your diet. Drinking too much alcohol. Other risk factors include: Having a personal history of heart disease, diabetes, high cholesterol, or kidney disease. Stress. Having a family history of high blood pressure and high cholesterol. Having obstructive sleep apnea. Age. The risk increases with age. What are the signs or symptoms? High blood pressure may not cause symptoms. Very high blood pressure (hypertensive crisis) may cause: Headache. Fast or irregular heartbeats (palpitations). Shortness of breath. Nosebleed. Nausea and vomiting. Vision changes. Severe chest pain, dizziness, and seizures. How is this diagnosed? This condition is diagnosed by  measuring your blood pressure while you are seated, with your arm resting on a flat surface, your legs uncrossed, and your feet flat on the floor. The cuff of the blood pressure monitor will be placed directly against the skin of your upper arm at the level of your heart. Blood pressure should be measured at least twice using the same arm. Certain conditions can cause a difference in blood pressure between your right and left arms. If you have a high blood pressure reading during one visit or you have normal blood pressure with other risk factors, you may be asked to: Return on a different day to have your blood pressure checked again. Monitor your blood pressure at home for 1 week or longer. If you are diagnosed with hypertension, you may have other blood or imaging tests to help your health care provider understand your overall risk for other conditions. How is this treated? This condition is treated by making healthy lifestyle changes, such as eating healthy foods, exercising more, and reducing your alcohol intake. You may be referred for counseling on a healthy diet and physical activity. Your health care provider may prescribe medicine if lifestyle changes are not enough to get your blood pressure under control and if: Your systolic blood pressure is above 130. Your diastolic blood pressure is above 80. Your personal target blood pressure may vary depending on your medical conditions, your age, and other factors. Follow these instructions at home: Eating and drinking  Eat a diet that is high in fiber and potassium, and low in sodium, added sugar, and fat. An example of this eating plan is called the DASH diet. DASH stands for Dietary Approaches to Stop Hypertension. To eat this way: Eat  plenty of fresh fruits and vegetables. Try to fill one half of your plate at each meal with fruits and vegetables. Eat whole grains, such as whole-wheat pasta, brown rice, or whole-grain bread. Fill about one  fourth of your plate with whole grains. Eat or drink low-fat dairy products, such as skim milk or low-fat yogurt. Avoid fatty cuts of meat, processed or cured meats, and poultry with skin. Fill about one fourth of your plate with lean proteins, such as fish, chicken without skin, beans, eggs, or tofu. Avoid pre-made and processed foods. These tend to be higher in sodium, added sugar, and fat. Reduce your daily sodium intake. Many people with hypertension should eat less than 1,500 mg of sodium a day. Do not drink alcohol if: Your health care provider tells you not to drink. You are pregnant, may be pregnant, or are planning to become pregnant. If you drink alcohol: Limit how much you have to: 0-1 drink a day for women. 0-2 drinks a day for men. Know how much alcohol is in your drink. In the [place] one drink equals one 12 oz bottle of beer (355 mL), one 5 oz glass of wine (148 mL), or one 1 oz glass of hard liquor (44 mL). Lifestyle  Work with your health care provider to maintain a healthy body weight or to lose weight. Ask what an ideal weight is for you. Get at least 30 minutes of exercise that causes your heart to beat faster (aerobic exercise) most days of the week. Activities may include walking, swimming, or biking. Include exercise to strengthen your muscles (resistance exercise), such as Pilates or lifting weights, as part of your weekly exercise routine. Try to do these types of exercises for 30 minutes at least 3 days a week. Do not use any products that contain nicotine or tobacco. These products include cigarettes, chewing tobacco, and vaping devices, such as e-cigarettes. If you need help quitting, ask your health care provider. Monitor your blood pressure at home as told by your health care provider. Keep all follow-up visits. This is important. Medicines Take over-the-counter and prescription medicines only as told by your health care provider. Follow directions carefully. Blood  pressure medicines must be taken as prescribed. Do not skip doses of blood pressure medicine. Doing this puts you at risk for problems and can make the medicine less effective. Ask your health care provider about side effects or reactions to medicines that you should watch for. Contact a health care provider if you: Think you are having a reaction to a medicine you are taking. Have headaches that keep coming back (recurring). Feel dizzy. Have swelling in your ankles. Have trouble with your vision. Get help right away if you: Develop a severe headache or confusion. Have unusual weakness or numbness. Feel faint. Have severe pain in your chest or abdomen. Vomit repeatedly. Have trouble breathing. These symptoms may be an emergency. Get help right away. Call 911. Do not wait to see if the symptoms will go away. Do not drive yourself to the hospital. Summary Hypertension is when the force of blood pumping through your arteries is too strong. If this condition is not controlled, it may put you at risk for serious complications. Your personal target blood pressure may vary depending on your medical conditions, your age, and other factors. For most people, a normal blood pressure is less than 120/80. Hypertension is treated with lifestyle changes, medicines, or a combination of both. Lifestyle changes include losing weight, eating a healthy,  low-sodium diet, exercising more, and limiting alcohol. This information is not intended to replace advice given to you by your health care provider. Make sure you discuss any questions you have with your health care provider. Document Revised: 10/03/2021 Document Reviewed: 10/03/2021 Elsevier Patient Education  2024 [employer].

## 2024-01-13 NOTE — Progress Notes (Signed)
Virtual Visit via Video Note  I connected with Tracy Kane on 01/13/24 at 11:20 AM EST by a video enabled telemedicine application and verified that I am speaking with the correct person using two identifiers.  Location: Patient: Work Provider: Engineer, structural in this video call: [name], NP-C and Tracy Kane   I discussed the limitations of evaluation and management by telemedicine and the availability of in person appointments. The patient expressed understanding and agreed to proceed.  History of Present Illness:  Discussed the use of AI scribe software for clinical note transcription with the patient, who gave verbal consent to proceed.   The patient presents with extreme dizziness and severe headaches.  She experiences severe headaches, described as 'really bad headaches' rather than migraines, without sensitivity to light or sound but sometimes accompanied by nausea. The headaches occasionally occur in the temple area. She also experiences shortness of breath but no vision changes or chest tightness.  Recently, she has noted elevated blood pressure readings, with a measurement of 158/90, despite no history of hypertension. She experiences episodes of shakiness, feeling cold and then hot, and dizziness, which sometimes improve after eating. She denies a history of migraines or diabetes, and her glucose levels were normal at the last check. She maintains adequate hydration and describes her stress levels and sleep patterns as normal.  She has a history of gastric sleeve surgery and cannot take ibuprofen. Tylenol is effective for her headaches but does not alleviate dizziness.          Past Medical History:  Diagnosis Date   Anxiety    Arthritis    Depression    Hyperlipidemia     Current Outpatient Medications  Medication Sig Dispense Refill   atorvastatin (LIPITOR) 10 MG tablet Take 1 tablet (10 mg total) by mouth daily. 90 tablet 1    cyclobenzaprine (FLEXERIL) 10 MG tablet Take 1 tablet (10 mg total) by mouth 3 (three) times daily as needed for muscle spasms. 15 tablet 0   escitalopram (LEXAPRO) 20 MG tablet Take 1 tablet (20 mg total) by mouth daily. 90 tablet 1   omeprazole (PRILOSEC) 20 MG capsule Take 1 capsule (20 mg total) by mouth daily as needed. 90 capsule 1   No current facility-administered medications for this visit.    No Known Allergies  Family History  Problem Relation Age of Onset   Breast cancer Paternal Grandmother     Social History   Socioeconomic History   Marital status: Single    Spouse name: Not on file   Number of children: Not on file   Years of education: Not on file   Highest education level: Not on file  Occupational History   Not on file  Tobacco Use   Smoking status: Never   Smokeless tobacco: Never  Vaping Use   Vaping status: Never Used  Substance and Sexual Activity   Alcohol use: Yes    Comment: occasionally   Drug use: No   Sexual activity: Not Currently  Other Topics Concern   Not on file  Social History Narrative   Not on file   Social Drivers of Health   Financial Resource Strain: Not on file  Food Insecurity: Not on file  Transportation Needs: Not on file  Physical Activity: Not on file  Stress: Not on file  Social Connections: Not on file  Intimate Partner Violence: Not on file     Constitutional: Patient reports headaches.  Denies fever, malaise, fatigue, or  abrupt weight changes.  HEENT: Denies eye pain, eye redness, ear pain, ringing in the ears, wax buildup, runny nose, nasal congestion, bloody nose, or sore throat. Respiratory: Pt reports shortness of breath. Denies difficulty breathing, cough or sputum production.   Cardiovascular: Denies chest pain, chest tightness, palpitations or swelling in the hands or feet.  Gastrointestinal: Pt reports nausea. Denies abdominal pain, bloating, constipation, diarrhea or blood in the stool.  GU: Denies  urgency, frequency, pain with urination, burning sensation, blood in urine, odor or discharge. Musculoskeletal: Patient reports joint pain.  Denies decrease in range of motion, difficulty with gait, muscle pain or joint swelling.  Skin: Denies redness, rashes, lesions or ulcercations.  Neurological: Patient reports intermittent dizziness.  Denies difficulty with memory, difficulty with speech or problems with balance and coordination.  Psych: Patient has a history of anxiety and depression.  Denies SI/HI.  No other specific complaints in a complete review of systems (except as listed in HPI above).  Observations/Objective:   Wt Readings from Last 3 Encounters:  06/25/23 244 lb (110.7 kg)  05/17/22 250 lb (113.4 kg)  09/04/21 243 lb 12.8 oz (110.6 kg)    General: Appears her stated age, well developed, well nourished in NAD. HEENT: Head: normal shape and size; Eyes: EOMs intact;  Pulmonary/Chest: Normal effort . No respiratory distress.  Neurological: Alert and oriented. Coordination normal.  Psychiatric: Mood and affect normal. Behavior is normal. Judgment and thought content normal.     BMET    Component Value Date/Time   NA 142 06/25/2023 0845   NA 139 09/05/2021 1414   K 3.9 06/25/2023 0845   CL 106 06/25/2023 0845   CO2 27 06/25/2023 0845   GLUCOSE 91 06/25/2023 0845   BUN 14 06/25/2023 0845   BUN 6 09/05/2021 1414   CREATININE 0.64 06/25/2023 0845   CALCIUM 9.2 06/25/2023 0845   GFRNONAA >60 05/18/2017 1952   GFRAA >60 05/18/2017 1952    Lipid Panel     Component Value Date/Time   CHOL 218 (H) 06/25/2023 0845   CHOL 214 (H) 09/05/2021 1414   TRIG 112 06/25/2023 0845   HDL 54 06/25/2023 0845   HDL 53 09/05/2021 1414   CHOLHDL 4.0 06/25/2023 0845   LDLCALC 141 (H) 06/25/2023 0845    CBC    Component Value Date/Time   WBC 5.8 06/25/2023 0845   RBC 4.39 06/25/2023 0845   HGB 13.4 06/25/2023 0845   HGB 14.0 09/05/2021 1414   HCT 40.6 06/25/2023 0845   HCT  42.1 09/05/2021 1414   PLT 398 06/25/2023 0845   PLT 402 09/05/2021 1414   MCV 92.5 06/25/2023 0845   MCV 90 09/05/2021 1414   MCH 30.5 06/25/2023 0845   MCHC 33.0 06/25/2023 0845   RDW 12.1 06/25/2023 0845   RDW 12.7 09/05/2021 1414    Hgb A1C Lab Results  Component Value Date   HGBA1C 5.5 06/25/2023       Assessment and Plan:  Assessment and Plan    Headaches, dizziness, elevated blood pressure readings Newly elevated blood pressure readings with associated symptoms of severe headaches, dizziness, and shortness of breath. No prior history of hypertension. -Schedule physical exam and full lab workup on 01/24/2024 at 8:00 AM to assess kidney function, A1c, and glucose levels. -Check blood pressure in office. -Encourage adequate water intake -Advise to take Tylenol as needed for headache relief. -Prescribe Meclizine 25mg  for dizziness, to be taken every 8 hours as needed.   Schedule an appointment for  your annual exam  Follow Up Instructions:    I discussed the assessment and treatment plan with the patient. The patient was provided an opportunity to ask questions and all were answered. The patient agreed with the plan and demonstrated an understanding of the instructions.   The patient was advised to call back or seek an in-person evaluation if the symptoms worsen or if the condition fails to improve as anticipated.   [name], NP

## 2024-01-15 ENCOUNTER — Encounter: Payer: Self-pay | Admitting: *Deleted

## 2024-01-22 DIAGNOSIS — Z7689 Persons encountering health services in other specified circumstances: Secondary | ICD-10-CM | POA: Diagnosis not present

## 2024-01-24 ENCOUNTER — Ambulatory Visit (INDEPENDENT_AMBULATORY_CARE_PROVIDER_SITE_OTHER): Payer: Medicaid Other | Admitting: Internal Medicine

## 2024-01-24 ENCOUNTER — Other Ambulatory Visit (HOSPITAL_COMMUNITY)
Admission: RE | Admit: 2024-01-24 | Discharge: 2024-01-24 | Disposition: A | Discharge status: Home / Self Care | Payer: Medicaid Other | Source: Ambulatory Visit | Attending: Internal Medicine | Admitting: Internal Medicine

## 2024-01-24 ENCOUNTER — Encounter: Payer: Self-pay | Admitting: Internal Medicine

## 2024-01-24 VITALS — BP 124/82 | Ht 62.0 in | Wt 248.8 lb

## 2024-01-24 DIAGNOSIS — Z7689 Persons encountering health services in other specified circumstances: Secondary | ICD-10-CM | POA: Diagnosis not present

## 2024-01-24 DIAGNOSIS — Z1231 Encounter for screening mammogram for malignant neoplasm of breast: Secondary | ICD-10-CM | POA: Diagnosis not present

## 2024-01-24 DIAGNOSIS — R739 Hyperglycemia, unspecified: Secondary | ICD-10-CM | POA: Diagnosis not present

## 2024-01-24 DIAGNOSIS — R350 Frequency of micturition: Secondary | ICD-10-CM | POA: Diagnosis not present

## 2024-01-24 DIAGNOSIS — N898 Other specified noninflammatory disorders of vagina: Secondary | ICD-10-CM | POA: Diagnosis not present

## 2024-01-24 DIAGNOSIS — E7800 Pure hypercholesterolemia, unspecified: Secondary | ICD-10-CM

## 2024-01-24 DIAGNOSIS — R0683 Snoring: Secondary | ICD-10-CM | POA: Diagnosis not present

## 2024-01-24 DIAGNOSIS — R0681 Apnea, not elsewhere classified: Secondary | ICD-10-CM

## 2024-01-24 DIAGNOSIS — Z0001 Encounter for general adult medical examination with abnormal findings: Secondary | ICD-10-CM | POA: Diagnosis not present

## 2024-01-24 DIAGNOSIS — Z1211 Encounter for screening for malignant neoplasm of colon: Secondary | ICD-10-CM

## 2024-01-24 NOTE — Progress Notes (Signed)
Subjective:    Patient ID: Tracy Kane, female    DOB: [birth year], 53 y.o.   MRN: [id number]  HPI  Patient presents to clinic today for annual exam.  She also reports urinary frequency, vaginal itching and odor.  She reports this started about 1 year ago.  She denies any pelvic pain, abnormal vaginal bleeding or discharge.  She reports she is not sexually active.  She also reports waking up "gasping for air".  She reports she has been told that she snores in the past.  She has never been told that she has sleep apnea.  She has never had a sleep study.  Flu: 08/2023 Tetanus: 05/2022 COVID: Pfizer x 2 Shingrix: never Pap smear: 05/2022 Mammogram: 06/2022 Colon screening: Never Vision screening: annually Dentist: biannually  Diet: She does eat lean meat. She consumes fruits and veggies. She tries to avoid fried foods. She drinks mostly water or green tea. Exercise: Walking   Review of Systems     Past Medical History:  Diagnosis Date   Anxiety    Arthritis    Depression    Hyperlipidemia     Current Outpatient Medications  Medication Sig Dispense Refill   atorvastatin (LIPITOR) 10 MG tablet Take 1 tablet (10 mg total) by mouth daily. 90 tablet 1   cyclobenzaprine (FLEXERIL) 10 MG tablet Take 1 tablet (10 mg total) by mouth 3 (three) times daily as needed for muscle spasms. 15 tablet 0   escitalopram (LEXAPRO) 20 MG tablet Take 1 tablet (20 mg total) by mouth daily. 90 tablet 1   meclizine (ANTIVERT) 25 MG tablet Take 1 tablet (25 mg total) by mouth 3 (three) times daily as needed for dizziness. 30 tablet 0   omeprazole (PRILOSEC) 20 MG capsule Take 1 capsule (20 mg total) by mouth daily as needed. 90 capsule 1   No current facility-administered medications for this visit.    No Known Allergies  Family History  Problem Relation Age of Onset   Breast cancer Paternal Grandmother     Social History   Socioeconomic History   Marital status: Single    Spouse name:  Not on file   Number of children: Not on file   Years of education: Not on file   Highest education level: Not on file  Occupational History   Not on file  Tobacco Use   Smoking status: Never   Smokeless tobacco: Never  Vaping Use   Vaping status: Never Used  Substance and Sexual Activity   Alcohol use: Yes    Comment: occasionally   Drug use: No   Sexual activity: Not Currently  Other Topics Concern   Not on file  Social History Narrative   Not on file   Social Drivers of Health   Financial Resource Strain: Not on file  Food Insecurity: Not on file  Transportation Needs: Not on file  Physical Activity: Not on file  Stress: Not on file  Social Connections: Not on file  Intimate Partner Violence: Not on file     Constitutional: Denies fever, malaise, fatigue, headache or abrupt weight changes.  HEENT: Denies eye pain, eye redness, ear pain, ringing in the ears, wax buildup, runny nose, nasal congestion, bloody nose, or sore throat. Respiratory: Denies difficulty breathing, shortness of breath, cough or sputum production.   Cardiovascular: Denies chest pain, chest tightness, palpitations or swelling in the hands or feet.  Gastrointestinal: Denies abdominal pain, bloating, constipation, diarrhea or blood in the stool.  GU: Pt reports  urinary frequency, vaginal itching and odor. Denies urgency, pain with urination, burning sensation, blood in urine, or discharge. Musculoskeletal: Patient reports joint pain.  Denies decrease in range of motion, difficulty with gait, muscle pain or joint swelling.  Skin: Denies redness, rashes, or ulcercations.  Neurological: Patient reports snoring, apneic episodes.  Denies dizziness, difficulty with memory, difficulty with speech or problems with balance and coordination.  Psych: Patient has a history of anxiety and depression.  Denies SI/HI.  No other specific complaints in a complete review of systems (except as listed in HPI  above).  Objective:   Physical Exam  BP 124/82 (BP Location: Left Arm, Patient Position: Sitting, Cuff Size: Large)   Ht 5\' 2"  (1.575 m)   Wt 248 lb 12.8 oz (112.9 kg)   BMI 45.51 kg/m    Wt Readings from Last 3 Encounters:  06/25/23 244 lb (110.7 kg)  05/17/22 250 lb (113.4 kg)  09/04/21 243 lb 12.8 oz (110.6 kg)    General: Appears her stated age, obese, in NAD. Skin: Warm, dry and intact. No rashes noted.  HEENT: Head: normal shape and size; Eyes: sclera white, no icterus, conjunctiva pink, PERRLA and EOMs intact;  Neck:  Neck supple, trachea midline. No masses, lumps or thyromegaly present.  Cardiovascular: Normal rate and rhythm. S1,S2 noted.  No murmur, rubs or gallops noted. No JVD or BLE edema.  Pulmonary/Chest: Normal effort and positive vesicular breath sounds. No respiratory distress. No wheezes, rales or ronchi noted.  Abdomen: Soft and nontender. Normal bowel sounds.  Musculoskeletal: Strength 5/5 BUE/BLE. No difficulty with gait.  Neurological: Alert and oriented. Cranial nerves II-XII grossly intact. Coordination normal.  Psychiatric: Mood and affect normal. Behavior is normal. Judgment and thought content normal.     BMET    Component Value Date/Time   NA 142 06/25/2023 0845   NA 139 09/05/2021 1414   K 3.9 06/25/2023 0845   CL 106 06/25/2023 0845   CO2 27 06/25/2023 0845   GLUCOSE 91 06/25/2023 0845   BUN 14 06/25/2023 0845   BUN 6 09/05/2021 1414   CREATININE 0.64 06/25/2023 0845   CALCIUM 9.2 06/25/2023 0845   GFRNONAA >60 05/18/2017 1952   GFRAA >60 05/18/2017 1952    Lipid Panel     Component Value Date/Time   CHOL 218 (H) 06/25/2023 0845   CHOL 214 (H) 09/05/2021 1414   TRIG 112 06/25/2023 0845   HDL 54 06/25/2023 0845   HDL 53 09/05/2021 1414   CHOLHDL 4.0 06/25/2023 0845   LDLCALC 141 (H) 06/25/2023 0845    CBC    Component Value Date/Time   WBC 5.8 06/25/2023 0845   RBC 4.39 06/25/2023 0845   HGB 13.4 06/25/2023 0845   HGB  14.0 09/05/2021 1414   HCT 40.6 06/25/2023 0845   HCT 42.1 09/05/2021 1414   PLT 398 06/25/2023 0845   PLT 402 09/05/2021 1414   MCV 92.5 06/25/2023 0845   MCV 90 09/05/2021 1414   MCH 30.5 06/25/2023 0845   MCHC 33.0 06/25/2023 0845   RDW 12.1 06/25/2023 0845   RDW 12.7 09/05/2021 1414    Hgb A1C Lab Results  Component Value Date   HGBA1C 5.5 06/25/2023           Assessment & Plan:   Preventative Health Maintenance:  Flu shot UTD per her report Tetanus UTD Encouraged her to get her COVID-vaccine Pap smear UTD Mammogram ordered-she will call to schedule Referral to GI for screening colonoscopy Encouraged her to  consume a balanced diet and exercise regimen Advised her to see an eye doctor and dentist annually We will check CBC, c-Met, lipid, A1c today  Apnea episodes, snoring:  Referral to pulmonology for sleep study  Urinary frequency, vaginal itching and odor:  Will check wet prep for BV and yeast She declined screening for gonorrhea, chlamydia and trichomonas  RTC in 6 months, follow-up chronic conditions [name], NP

## 2024-01-24 NOTE — Patient Instructions (Signed)

## 2024-01-25 LAB — LIPID PANEL
Cholesterol: 212 mg/dL — ABNORMAL HIGH (ref <200)
HDL: 58 mg/dL (ref >=50)
LDL Cholesterol (Calc): 132 mg/dL — ABNORMAL HIGH
Non-HDL Cholesterol (Calc): 154 mg/dL — ABNORMAL HIGH (ref <130)
Total CHOL/HDL Ratio: 3.7 (calc) (ref <5.0)
Triglycerides: 108 mg/dL (ref <150)

## 2024-01-25 LAB — COMPLETE METABOLIC PANEL WITH GFR
AG Ratio: 1.2 (calc) (ref 1.0–2.5)
ALT: 14 U/L (ref 6–29)
AST: 17 U/L (ref 10–35)
Albumin: 4.3 g/dL (ref 3.6–5.1)
Alkaline phosphatase (APISO): 58 U/L (ref 37–153)
BUN: 14 mg/dL (ref 7–25)
CO2: 29 mmol/L (ref 20–32)
Calcium: 9.6 mg/dL (ref 8.6–10.4)
Chloride: 102 mmol/L (ref 98–110)
Creat: 0.69 mg/dL (ref 0.50–1.03)
Globulin: 3.5 g/dL (ref 1.9–3.7)
Glucose, Bld: 90 mg/dL (ref 65–99)
Potassium: 4.4 mmol/L (ref 3.5–5.3)
Sodium: 139 mmol/L (ref 135–146)
Total Bilirubin: 0.5 mg/dL (ref 0.2–1.2)
Total Protein: 7.8 g/dL (ref 6.1–8.1)
eGFR: 106 mL/min/{1.73_m2} (ref >=60)

## 2024-01-25 LAB — CBC
HCT: 41.6 % (ref 35.0–45.0)
Hemoglobin: 14 g/dL (ref 11.7–15.5)
MCH: 30.6 pg (ref 27.0–33.0)
MCHC: 33.7 g/dL (ref 32.0–36.0)
MCV: 91 fL (ref 80.0–100.0)
MPV: 9.4 fL (ref 7.5–12.5)
Platelets: 466 10*3/uL — ABNORMAL HIGH (ref 140–400)
RBC: 4.57 10*6/uL (ref 3.80–5.10)
RDW: 12.3 % (ref 11.0–15.0)
WBC: 7.5 10*3/uL (ref 3.8–10.8)

## 2024-01-25 LAB — HEMOGLOBIN A1C
Hgb A1c MFr Bld: 5.6 %{Hb} (ref <5.7)
Mean Plasma Glucose: 114 mg/dL
eAG (mmol/L): 6.3 mmol/L

## 2024-01-27 ENCOUNTER — Encounter: Payer: Self-pay | Admitting: Internal Medicine

## 2024-01-27 LAB — CERVICOVAGINAL ANCILLARY ONLY
Bacterial Vaginitis (gardnerella): POSITIVE — AB
Candida Glabrata: NEGATIVE
Candida Vaginitis: NEGATIVE
Comment: NEGATIVE
Comment: NEGATIVE
Comment: NEGATIVE

## 2024-01-31 ENCOUNTER — Encounter: Payer: Self-pay | Admitting: Internal Medicine

## 2024-01-31 MED ORDER — ATORVASTATIN CALCIUM 20 MG PO TABS
20.0000 mg | ORAL_TABLET | Freq: Every day | ORAL | 1 refills | Status: AC
Start: 2024-01-31 — End: ?

## 2024-02-04 MED ORDER — METRONIDAZOLE 0.75 % VA GEL
1.0000 | Freq: Two times a day (BID) | VAGINAL | 0 refills | Status: DC
Start: 2024-02-04 — End: 2024-05-09

## 2024-02-08 DIAGNOSIS — Z419 Encounter for procedure for purposes other than remedying health state, unspecified: Secondary | ICD-10-CM | POA: Diagnosis not present

## 2024-03-21 DIAGNOSIS — Z419 Encounter for procedure for purposes other than remedying health state, unspecified: Secondary | ICD-10-CM | POA: Diagnosis not present

## 2024-04-20 DIAGNOSIS — Z419 Encounter for procedure for purposes other than remedying health state, unspecified: Secondary | ICD-10-CM | POA: Diagnosis not present

## 2024-05-08 ENCOUNTER — Other Ambulatory Visit: Payer: Self-pay

## 2024-05-08 ENCOUNTER — Emergency Department

## 2024-05-08 ENCOUNTER — Encounter: Payer: Self-pay | Admitting: Emergency Medicine

## 2024-05-08 ENCOUNTER — Observation Stay
Admission: EM | Admit: 2024-05-08 | Discharge: 2024-05-09 | Disposition: A | Discharge status: Home / Self Care | Attending: Internal Medicine | Admitting: Internal Medicine

## 2024-05-08 DIAGNOSIS — R55 Syncope and collapse: Secondary | ICD-10-CM | POA: Diagnosis not present

## 2024-05-08 DIAGNOSIS — R0602 Shortness of breath: Secondary | ICD-10-CM | POA: Insufficient documentation

## 2024-05-08 DIAGNOSIS — M436 Torticollis: Secondary | ICD-10-CM | POA: Diagnosis not present

## 2024-05-08 DIAGNOSIS — R42 Dizziness and giddiness: Principal | ICD-10-CM

## 2024-05-08 DIAGNOSIS — E785 Hyperlipidemia, unspecified: Secondary | ICD-10-CM | POA: Insufficient documentation

## 2024-05-08 DIAGNOSIS — Z6841 Body Mass Index (BMI) 40.0 and over, adult: Secondary | ICD-10-CM | POA: Diagnosis not present

## 2024-05-08 DIAGNOSIS — F32A Depression, unspecified: Secondary | ICD-10-CM | POA: Insufficient documentation

## 2024-05-08 DIAGNOSIS — R112 Nausea with vomiting, unspecified: Secondary | ICD-10-CM | POA: Diagnosis not present

## 2024-05-08 DIAGNOSIS — E66813 Obesity, class 3: Secondary | ICD-10-CM | POA: Diagnosis not present

## 2024-05-08 DIAGNOSIS — Z79899 Other long term (current) drug therapy: Secondary | ICD-10-CM | POA: Insufficient documentation

## 2024-05-08 DIAGNOSIS — R519 Headache, unspecified: Secondary | ICD-10-CM | POA: Diagnosis not present

## 2024-05-08 DIAGNOSIS — H8111 Benign paroxysmal vertigo, right ear: Secondary | ICD-10-CM

## 2024-05-08 LAB — COMPREHENSIVE METABOLIC PANEL WITH GFR
ALT: 16 U/L (ref 0–44)
AST: 19 U/L (ref 15–41)
Albumin: 3.5 g/dL (ref 3.5–5.0)
Alkaline Phosphatase: 51 U/L (ref 38–126)
Anion gap: 8 (ref 5–15)
BUN: 9 mg/dL (ref 6–20)
CO2: 24 mmol/L (ref 22–32)
Calcium: 8.7 mg/dL — ABNORMAL LOW (ref 8.9–10.3)
Chloride: 105 mmol/L (ref 98–111)
Creatinine, Ser: 0.56 mg/dL (ref 0.44–1.00)
GFR, Estimated: >60 mL/min (ref >60)
Glucose, Bld: 118 mg/dL — ABNORMAL HIGH (ref 70–99)
Potassium: 3.9 mmol/L (ref 3.5–5.1)
Sodium: 137 mmol/L (ref 135–145)
Total Bilirubin: 0.5 mg/dL (ref 0.0–1.2)
Total Protein: 7.2 g/dL (ref 6.5–8.1)

## 2024-05-08 LAB — URINALYSIS, ROUTINE W REFLEX MICROSCOPIC
Bilirubin Urine: NEGATIVE
Glucose, UA: 50 mg/dL — AB
Hgb urine dipstick: NEGATIVE
Ketones, ur: NEGATIVE mg/dL
Leukocytes,Ua: NEGATIVE
Nitrite: NEGATIVE
Protein, ur: NEGATIVE mg/dL
Specific Gravity, Urine: 1.011 (ref 1.005–1.030)
pH: 7 (ref 5.0–8.0)

## 2024-05-08 LAB — CBC
HCT: 39.2 % (ref 36.0–46.0)
Hemoglobin: 12.8 g/dL (ref 12.0–15.0)
MCH: 30.1 pg (ref 26.0–34.0)
MCHC: 32.7 g/dL (ref 30.0–36.0)
MCV: 92.2 fL (ref 80.0–100.0)
Platelets: 376 10*3/uL (ref 150–400)
RBC: 4.25 MIL/uL (ref 3.87–5.11)
RDW: 13 % (ref 11.5–15.5)
WBC: 5.9 10*3/uL (ref 4.0–10.5)
nRBC: 0 % (ref 0.0–0.2)

## 2024-05-08 LAB — TSH: TSH: 2.452 u[IU]/mL (ref 0.350–4.500)

## 2024-05-08 LAB — HIV ANTIBODY (ROUTINE TESTING W REFLEX): HIV Screen 4th Generation wRfx: NONREACTIVE

## 2024-05-08 LAB — TROPONIN I (HIGH SENSITIVITY)
Troponin I (High Sensitivity): 3 ng/L (ref <18)
Troponin I (High Sensitivity): 3 ng/L (ref <18)

## 2024-05-08 LAB — PREGNANCY, URINE: Preg Test, Ur: NEGATIVE

## 2024-05-08 MED ORDER — MECLIZINE HCL 25 MG PO TABS
25.0000 mg | ORAL_TABLET | Freq: Once | ORAL | Status: AC
  Administered 2024-05-08: 25 mg via ORAL
  Filled 2024-05-08: qty 1

## 2024-05-08 MED ORDER — OXYCODONE HCL 5 MG PO TABS: 5.0000 mg | ORAL_TABLET | ORAL | Status: DC | PRN

## 2024-05-08 MED ORDER — BUTALBITAL-APAP-CAFFEINE 50-325-40 MG PO TABS: 1.0000 | ORAL_TABLET | Freq: Four times a day (QID) | ORAL | Status: DC | PRN

## 2024-05-08 MED ORDER — MECLIZINE HCL 25 MG PO TABS
25.0000 mg | ORAL_TABLET | Freq: Three times a day (TID) | ORAL | Status: DC
  Administered 2024-05-08 – 2024-05-09 (×4): 25 mg via ORAL
  Filled 2024-05-08 (×4): qty 1

## 2024-05-08 MED ORDER — ONDANSETRON HCL 4 MG PO TABS: 4.0000 mg | ORAL_TABLET | Freq: Four times a day (QID) | ORAL | Status: DC | PRN

## 2024-05-08 MED ORDER — ONDANSETRON HCL 4 MG/2ML IJ SOLN
4.0000 mg | Freq: Once | INTRAMUSCULAR | Status: AC
  Administered 2024-05-08: 4 mg via INTRAVENOUS
  Filled 2024-05-08: qty 2

## 2024-05-08 MED ORDER — DIAZEPAM 5 MG/ML IJ SOLN
2.5000 mg | Freq: Once | INTRAMUSCULAR | Status: AC
  Administered 2024-05-08: 2.5 mg via INTRAVENOUS
  Filled 2024-05-08: qty 2

## 2024-05-08 MED ORDER — SODIUM CHLORIDE 0.9% FLUSH: 3.0000 mL | Freq: Once | INTRAVENOUS | Status: DC

## 2024-05-08 MED ORDER — ENOXAPARIN SODIUM 60 MG/0.6ML IJ SOSY
0.5000 mg/kg | PREFILLED_SYRINGE | INTRAMUSCULAR | Status: DC
  Administered 2024-05-08: 55 mg via SUBCUTANEOUS
  Filled 2024-05-08: qty 0.6

## 2024-05-08 MED ORDER — ONDANSETRON HCL 4 MG/2ML IJ SOLN: 4.0000 mg | Freq: Four times a day (QID) | INTRAMUSCULAR | Status: DC | PRN

## 2024-05-08 MED ORDER — ACETAMINOPHEN 325 MG PO TABS
650.0000 mg | ORAL_TABLET | Freq: Four times a day (QID) | ORAL | Status: DC | PRN
  Administered 2024-05-08 – 2024-05-09 (×3): 650 mg via ORAL
  Filled 2024-05-08 (×3): qty 2

## 2024-05-08 MED ORDER — ALBUTEROL SULFATE (2.5 MG/3ML) 0.083% IN NEBU: 2.5000 mg | INHALATION_SOLUTION | RESPIRATORY_TRACT | Status: DC | PRN

## 2024-05-08 MED ORDER — SODIUM CHLORIDE 0.9 % IV BOLUS (SEPSIS)
1000.0000 mL | Freq: Once | INTRAVENOUS | Status: AC
  Administered 2024-05-08: 1000 mL via INTRAVENOUS

## 2024-05-08 MED ORDER — ACETAMINOPHEN 650 MG RE SUPP: 650.0000 mg | Freq: Four times a day (QID) | RECTAL | Status: DC | PRN

## 2024-05-08 MED ORDER — KETOROLAC TROMETHAMINE 30 MG/ML IJ SOLN: 30.0000 mg | Freq: Once | INTRAMUSCULAR | Status: DC

## 2024-05-08 MED ORDER — IOHEXOL 350 MG/ML SOLN
75.0000 mL | Freq: Once | INTRAVENOUS | Status: AC | PRN
  Administered 2024-05-08: 75 mL via INTRAVENOUS

## 2024-05-08 MED ORDER — LORAZEPAM 2 MG/ML IJ SOLN
1.0000 mg | Freq: Once | INTRAMUSCULAR | Status: AC
  Administered 2024-05-08: 1 mg via INTRAVENOUS
  Filled 2024-05-08: qty 1

## 2024-05-08 MED ORDER — SODIUM CHLORIDE 0.9 % IV SOLN: INTRAVENOUS | Status: DC

## 2024-05-08 MED ORDER — ESCITALOPRAM OXALATE 10 MG PO TABS
20.0000 mg | ORAL_TABLET | Freq: Every day | ORAL | Status: DC
  Administered 2024-05-08 – 2024-05-09 (×2): 20 mg via ORAL
  Filled 2024-05-08 (×2): qty 2

## 2024-05-08 NOTE — Progress Notes (Signed)
 Anticoagulation monitoring(Lovenox):  52yo  F ordered Lovenox 40 mg Q24h    Filed Weights   05/08/24 0338  Weight: 111.1 kg (245 lb)   BMI 44   Lab Results  Component Value Date   CREATININE 0.56 05/08/2024   CREATININE 0.69 01/24/2024   CREATININE 0.64 06/25/2023   Estimated Creatinine Clearance: 98.9 mL/min (by C-G formula based on SCr of 0.56 mg/dL). Hemoglobin & Hematocrit     Component Value Date/Time   HGB 12.8 05/08/2024 0428   HGB 14.0 09/05/2021 1414   HCT 39.2 05/08/2024 0428   HCT 42.1 09/05/2021 1414     Per Protocol for Patient with estCrcl > 30 ml/min and BMI > 30, will transition to Lovenox 0.5 mg/kg Q24h       Thomasine Flick PharmD Clinical Pharmacist 05/08/2024

## 2024-05-08 NOTE — Evaluation (Signed)
 Physical Therapy Evaluation Patient Details Name: Tracy Kane MRN: [id number] DOB: 01/26/1971 Today's Date: 05/08/2024  History of Present Illness  Pt is a 53 y.o. female with significant PMH of hyperlipidemia, GERD, depression, chronic dizziness presents for chief complaint of intractable dizziness associated nausea and vomiting worsening over the past 3 days.  No clear triggers.  Patient states that she previously takes meclizine  however for the past few days meclizine  has been ineffective and patient has been vomiting with any attempted p.o. intake.   Clinical Impression  Pt admitted with above diagnosis. Pt currently with functional limitations due to the deficits listed below (see PT Problem List). Pt received supine in bed agreeable to PT. Understanding of PT assessment is screening her dizziness. Pt with history of R ear draining. Symptoms initially very minimal and brief with head movements mostly bed mobility and cervical extension. Denies head trauma or recent viral illness. Sensations described as spinning for seconds but came to ED as symptoms were persisting for minutes. Reports being fully independent prior to admission.   To date, pt moves independently to supervision during session. Orthostatics negative along with horizontal canal BPPV. Positive R ear dix Hallpike. Symptoms persist ~30-45 seconds with positional changes likely indicative of cupulolithiasis. X2 Epley maneuvers completed with pt reporting mild improvement with second maneuver. Discussed OPPT vestibular referral and activity modifications to avoid symptom exacerbation. Pt left in care of OT.      If plan is discharge home, recommend the following: Help with stairs or ramp for entrance;Assist for transportation   Can travel by private vehicle        Equipment Recommendations None recommended by PT  Recommendations for Other Services       Functional Status Assessment Patient has had a recent decline in their  functional status and demonstrates the ability to make significant improvements in function in a reasonable and predictable amount of time.     Precautions / Restrictions Precautions Precautions: Fall Restrictions Weight Bearing Restrictions Per Provider Order: No      Mobility  Bed Mobility Overal bed mobility: Modified Independent             General bed mobility comments: increased time-edu to move slower to prevent further dizziness Patient Response: Cooperative  Transfers Overall transfer level: Needs assistance   Transfers: Sit to/from Stand Sit to Stand: Supervision                Ambulation/Gait                  Stairs            Wheelchair Mobility     Tilt Bed Tilt Bed Patient Response: Cooperative  Modified Rankin (Stroke Patients Only)       Balance Overall balance assessment: Needs assistance Sitting-balance support: No upper extremity supported, Feet supported Sitting balance-Leahy Scale: Good       Standing balance-Leahy Scale: Fair Standing balance comment: supervision, no AD, fall risk d/t dizziness                             Pertinent Vitals/Pain Pain Assessment Pain Assessment: Faces Faces Pain Scale: Hurts little more Pain Location: headache Pain Descriptors / Indicators: Headache Pain Intervention(s): Monitored during session, Repositioned    Home Living Family/patient expects to be discharged to:: Private residence Living Arrangements: Alone Available Help at Discharge: Family;Available PRN/intermittently (one daughter local) Type of Home: House Home Access: Stairs to enter  Entrance Stairs-Rails: Right;Left;Can reach both Entrance Stairs-Number of Steps: 5-6   Home Layout: One level Home Equipment: None      Prior Function Prior Level of Function : Independent/Modified Independent;Working/employed;Driving             Mobility Comments: IND, denies falls ADLs Comments: IND, 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, drives     Extremity/Trunk Assessment   Upper Extremity Assessment Upper Extremity Assessment: Overall WFL for tasks assessed    Lower Extremity Assessment Lower Extremity Assessment: Overall WFL for tasks assessed       Communication   Communication Communication: No apparent difficulties    Cognition Arousal: Alert Behavior During Therapy: WFL for tasks assessed/performed   PT - Cognitive impairments: No apparent impairments                         Following commands: Intact       Cueing       General Comments General comments (skin integrity, edema, etc.): orthostatics negative    Exercises Other Exercises Other Exercises: R ear posterior canal positive via dix hallpike. Horizontal canal negative bilat.  x2 Epley maneuvers. Discussed d/c recs and modifications to prevent onset of dizziness.   Assessment/Plan    PT Assessment Patient needs continued PT services  PT Problem List Decreased balance       PT Treatment Interventions Therapeutic exercise;Neuromuscular re-education;Functional mobility training;Therapeutic activities;Patient/family education    PT Goals (Current goals can be found in the Care Plan section)  Acute Rehab PT Goals Patient Stated Goal: figure out her dizziness PT Goal Formulation: With patient Time For Goal Achievement: 05/22/24 Potential to Achieve Goals: Good    Frequency Min 1X/week     Co-evaluation               AM-PAC PT "6 Clicks" Mobility  Outcome Measure Help needed turning from your back to your side while in a flat bed without using bedrails?: A Little Help needed moving from lying on your back to sitting on the side of a flat bed without using bedrails?: A Little Help needed moving to and from a bed to a chair (including a wheelchair)?: A Little Help needed standing up from a chair using your arms (e.g., wheelchair or bedside chair)?: A Little Help needed to  walk in hospital room?: A Little Help needed climbing 3-5 steps with a railing? : A Little 6 Click Score: 18    End of Session   Activity Tolerance: Patient tolerated treatment well Patient left: in bed (in care of OT) Nurse Communication: Mobility status PT Visit Diagnosis: Other abnormalities of gait and mobility (R26.89);BPPV BPPV - Right/Left : Right    Time: 1315-1407 PT Time Calculation (min) (ACUTE ONLY): 52 min   Charges:   PT Evaluation $PT Eval Low Complexity: 1 Low PT Treatments $Therapeutic Activity: 8-22 mins $Canalith Rep Proc: 8-22 mins PT General Charges $$ ACUTE PT VISIT: 1 Visit        Marc Senior. Fairly IV, PT, DPT Physical Therapist-   [facility]  05/08/2024, 4:15 PM

## 2024-05-08 NOTE — H&P (Signed)
 History and Physical    Alan Kane ZOX:[id number] DOB: January 03, 1971 DOA: 05/08/2024  PCP: Carollynn Cirri, NP  Patient coming from: Home  I have personally briefly reviewed patient's old medical records in [facility]  Chief Complaint: Dizziness  HPI: Tracy Kane is a 53 y.o. female with medical history significant of hyperlipidemia, GERD, depression, chronic dizziness presents for chief complaint of intractable dizziness associated nausea and vomiting worsening over the past 3 days.  No clear triggers.  Patient states that she previously takes meclizine  however for the past few days meclizine  has been ineffective and patient has been vomiting with any attempted p.o. intake.  ED Course: Patient received bolus of IV fluids and several doses of Antivert , Zofran , Valium , Ativan .  Symptoms persisted.  MRI imaging survey negative.  No evidence of CVA or aneurysm.  Persistent symptoms of unclear etiology.  Hospitalist contacted for admission.  Review of Systems: As per HPI otherwise 14 point review of systems negative.   Past Medical History:  Diagnosis Date   Anxiety    Arthritis    Depression    Hyperlipidemia     Past Surgical History:  Procedure Laterality Date   CESAREAN SECTION     LAPAROSCOPIC GASTRIC SLEEVE RESECTION  05/2014   TUBAL LIGATION       reports that she has never smoked. She has never used smokeless tobacco. She reports current alcohol use. She reports that she does not use drugs.  No Known Allergies  Family History  Problem Relation Age of Onset   Breast cancer Paternal Grandmother      Prior to Admission medications   Medication Sig Start Date End Date Taking? Authorizing Provider  atorvastatin  (LIPITOR) 20 MG tablet Take 1 tablet (20 mg total) by mouth daily. 01/31/24   Carollynn Cirri, NP  cyclobenzaprine  (FLEXERIL ) 10 MG tablet Take 1 tablet (10 mg total) by mouth 3 (three) times daily as needed for muscle spasms. Patient not taking:  Reported on 01/24/2024 06/25/23   Carollynn Cirri, NP  escitalopram  (LEXAPRO ) 20 MG tablet Take 1 tablet (20 mg total) by mouth daily. 06/25/23   Carollynn Cirri, NP  meclizine  (ANTIVERT ) 25 MG tablet Take 1 tablet (25 mg total) by mouth 3 (three) times daily as needed for dizziness. 01/13/24   Carollynn Cirri, NP  metroNIDAZOLE  (METROGEL ) 0.75 % vaginal gel Place 1 Applicatorful vaginally 2 (two) times daily. 02/04/24   Carollynn Cirri, NP  omeprazole  (PRILOSEC) 20 MG capsule Take 1 capsule (20 mg total) by mouth daily as needed. 06/25/23   Carollynn Cirri, NP    Physical Exam: Vitals:   05/08/24 0338 05/08/24 0344 05/08/24 0700  BP:  (!) 159/106 (!) 126/94  Pulse:  88 83  Resp:  17 (!) 23  Temp:  98.3 F (36.8 C)   TempSrc:  Oral   SpO2:  98% 98%  Weight: 111.1 kg    Height: 5\' 2"  (1.575 m)     General: No apparent distress, patient appears well HEENT: Normocephalic, atraumatic Neck, supple, trachea midline, no tenderness Heart: Regular rate and rhythm, S1/S2 normal, no murmurs Lungs: Lungs clear.  Normal work of breathing.  Room air Abdomen: Obese, soft, nontender, nondistended, positive bowel sounds Extremities: Normal, atraumatic, no clubbing or cyanosis, normal muscle tone Skin: No rashes or lesions, normal color Neurologic: Cranial nerves grossly intact, sensation intact, alert and oriented x3 Psychiatric: Normal affect   Labs on Admission: I have personally reviewed following labs and imaging studies  CBC: Recent Labs  Lab 05/08/24 0428  WBC 5.9  HGB 12.8  HCT 39.2  MCV 92.2  PLT 376   Basic Metabolic Panel: Recent Labs  Lab 05/08/24 0428  NA 137  K 3.9  CL 105  CO2 24  GLUCOSE 118*  BUN 9  CREATININE 0.56  CALCIUM  8.7*   GFR: Estimated Creatinine Clearance: 98.9 mL/min (by C-G formula based on SCr of 0.56 mg/dL). Liver Function Tests: Recent Labs  Lab 05/08/24 0428  AST 19  ALT 16  ALKPHOS 51  BILITOT 0.5  PROT 7.2  ALBUMIN 3.5   No results for  input(s): "LIPASE", "AMYLASE" in the last 168 hours. No results for input(s): "AMMONIA" in the last 168 hours. Coagulation Profile: No results for input(s): "INR", "PROTIME" in the last 168 hours. Cardiac Enzymes: No results for input(s): "CKTOTAL", "CKMB", "CKMBINDEX", "TROPONINI" in the last 168 hours. BNP (last 3 results) No results for input(s): "PROBNP" in the last 8760 hours. HbA1C: No results for input(s): "HGBA1C" in the last 72 hours. CBG: No results for input(s): "GLUCAP" in the last 168 hours. Lipid Profile: No results for input(s): "CHOL", "HDL", "LDLCALC", "TRIG", "CHOLHDL", "LDLDIRECT" in the last 72 hours. Thyroid  Function Tests: No results for input(s): "TSH", "T4TOTAL", "FREET4", "T3FREE", "THYROIDAB" in the last 72 hours. Anemia Panel: No results for input(s): "VITAMINB12", "FOLATE", "FERRITIN", "TIBC", "IRON", "RETICCTPCT" in the last 72 hours. Urine analysis:    Component Value Date/Time   COLORURINE STRAW (A) 05/08/2024 0534   APPEARANCEUR CLEAR (A) 05/08/2024 0534   LABSPEC 1.011 05/08/2024 0534   PHURINE 7.0 05/08/2024 0534   GLUCOSEU 50 (A) 05/08/2024 0534   HGBUR NEGATIVE 05/08/2024 0534   BILIRUBINUR NEGATIVE 05/08/2024 0534   KETONESUR NEGATIVE 05/08/2024 0534   PROTEINUR NEGATIVE 05/08/2024 0534   NITRITE NEGATIVE 05/08/2024 0534   LEUKOCYTESUR NEGATIVE 05/08/2024 0534    Radiological Exams on Admission: MR BRAIN WO CONTRAST Result Date: 05/08/2024 CLINICAL DATA:  53 year old female with neurologic deficit, intermittent dizziness for 3 days. Frontal headache, neck pain, nausea vomiting. EXAM: MRI HEAD WITHOUT CONTRAST TECHNIQUE: Multiplanar, multiecho pulse sequences of the brain and surrounding structures were obtained without intravenous contrast. COMPARISON:  CT head, CTA head and neck 0513 hours today. FINDINGS: Brain: Normal cerebral volume. Cavum vergae, normal variant. Other midline structures appear normally formed. No restricted diffusion to  suggest acute infarction. No midline shift, mass effect, evidence of mass lesion, ventriculomegaly, extra-axial collection or acute intracranial hemorrhage. Cervicomedullary junction and pituitary are within normal limits. Martina Sledge and white matter signal is normal for age throughout the brain. No encephalomalacia or chronic cerebral blood products identified. Vascular: Major intracranial vascular flow voids are preserved. Skull and upper cervical spine: Negative visible cervical spine. Background bone marrow signal is mildly heterogeneous, but no suspicious osseous lesion is identified. Sinuses/Orbits: Negative. Other: Grossly normal visible internal auditory structures. Negative visible scalp and face. IMPRESSION: Normal noncontrast MRI appearance of the Brain. Electronically Signed   By: Marlise Simpers M.D.   On: 05/08/2024 07:58   CT ANGIO HEAD NECK W WO CM Result Date: 05/08/2024 CLINICAL DATA:  53 year old female with neurologic deficit, intermittent dizziness for 3 days. Frontal headache, neck pain, nausea vomiting. EXAM: CT ANGIOGRAPHY HEAD AND NECK WITH AND WITHOUT CONTRAST TECHNIQUE: Multidetector CT imaging of the head and neck was performed using the standard protocol during bolus administration of intravenous contrast. Multiplanar CT image reconstructions and MIPs were obtained to evaluate the vascular anatomy. Carotid stenosis measurements (when applicable) are obtained utilizing NASCET  criteria, using the distal internal carotid diameter as the denominator. RADIATION DOSE REDUCTION: This exam was performed according to the departmental dose-optimization program which includes automated exposure control, adjustment of the mA and/or kV according to patient size and/or use of iterative reconstruction technique. CONTRAST:  75mL OMNIPAQUE IOHEXOL 350 MG/ML SOLN COMPARISON:  None Available. FINDINGS: CT HEAD Brain: Cerebral volume is within normal limits for age. No midline shift, ventriculomegaly, mass effect,  evidence of mass lesion, intracranial hemorrhage or evidence of cortically based acute infarction. Gray-white matter differentiation is within normal limits throughout the brain. Calvarium and skull base: Intact, negative. Paranasal sinuses: Tympanic cavities, paranasal sinuses and mastoids are well aerated. Orbits: Visualized orbits and scalp soft tissues are within normal limits. CTA NECK Skeleton: Mild for age cervical spine degeneration, C5-C6 disc space loss and endplate spurring. No acute osseous abnormality identified. Upper chest: Negative; mild respiratory motion. Other neck: Nonvascular neck soft tissue spaces are within normal limits. Aortic arch: 4 vessel arch, the left vertebral artery arises directly from the arch. No arch atherosclerosis. Right carotid system: Dense left subclavian contrast streak. Brachiocephalic artery and proximal right CCA appear normal. Negative right carotid bifurcation. Tortuous right ICA without plaque or stenosis. Left carotid system: Left subclavian venous streak artifact. Proximal left CCA appears to remain normal. Mild left CCA tortuosity. Negative left carotid bifurcation. Negative cervical left ICA. Vertebral arteries: Proximal right subclavian artery and right vertebral artery origin appear normal. Right vertebral artery appears patent and within normal limits to the skull base. Left vertebral artery arises directly from the arch, tortuous left V1 segment. Late entry into the cervical transverse foramen on series 12, image 248. Codominant left vertebral artery than patent and within normal limits to the skull base. CTA HEAD Posterior circulation: Codominant distal vertebral arteries, vertebrobasilar junction, left PICA origin, dominant appearing right AICA origin, basilar artery, SCA and PCA origins are patent and within normal limits. Posterior communicating arteries are diminutive or absent. Bilateral PCA branches are within normal limits. Anterior circulation: Both  ICA siphons are patent. No siphon plaque or stenosis. There does appear to be a normal right posterior communicating artery with associated small infundibulum on series 14, image 83 (normal variant). Patent carotid termini. Normal MCA and ACA origins. Mildly tortuous A1 segments. Normal anterior communicating artery. Bilateral ACA branches are within normal limits. MCA M1 segments and bilateral MCA branches are within normal limits. Venous sinuses: Early contrast timing, not well evaluated. Anatomic variants: Codominant left vertebral artery arises directly from the arch. Review of the MIP images confirms the above findings IMPRESSION: 1. Negative CTA Head and Neck.  Mild arterial tortuosity. 2. Normal for age CT appearance of the brain. Electronically Signed   By: Marlise Simpers M.D.   On: 05/08/2024 05:46   DG Chest Portable 1 View Result Date: 05/08/2024 CLINICAL DATA:  53 year old female with shortness of breath, dizziness. Nausea vomiting. Head and neck pain. EXAM: PORTABLE CHEST 1 VIEW COMPARISON:  Chest radiograph 05/18/2017. FINDINGS: Portable AP upright view at 0420 hours. Mild to moderate chronic cardiomegaly does not appear significantly changed. Other mediastinal contours are within normal limits. Visualized tracheal air column is within normal limits. Lower lung volumes. Bibasilar hypo ventilation. No superimposed pneumothorax, pulmonary edema, air bronchograms. No acute osseous abnormality identified. Paucity of bowel gas in visible abdomen. IMPRESSION: Cardiomegaly stable since 2018. Lower lung volumes with hypoventilation at the bases. Small pleural effusions would be difficult to exclude, but no pulmonary edema or other acute cardiopulmonary abnormality. Electronically Signed  By: Marlise Simpers M.D.   On: 05/08/2024 04:26    EKG: Independently reviewed.  Normal sinus rhythm  Assessment/Plan Principal Problem:   Postural dizziness with near syncope  Intractable dizziness Intractable nausea and  vomiting This is of unclear etiology.  MRI imaging survey reassuring.  No evidence of infection.  Metabolized within normal limits.  At this time suspect BPPV.  Patient unable to ambulate due to symptoms. Plan: Place in observation IV fluids PT and OT evaluations Fall precautions 3 times daily meclizine   Headache Likely associated with intractable dizziness Associated with light sensitivity Trial Fioricet  Depression PTA Lexapro   Hyperlipidemia Does not appear to be taking statin Outpatient follow-up  Stage III obesity BMI 44.8 Complicating factor in overall care and prognosis  DVT prophylaxis: SQ Lovenox Code Status: Full Family Communication: Family member at bedside 5/30 Disposition Plan: Anticipate return home environment Consults called: None Admission status: Observation, medical telemetry   Tiajuana Fluke MD [facility]   If 7PM-7AM, please contact night-coverage   05/08/2024, 9:20 AM

## 2024-05-08 NOTE — Evaluation (Signed)
 Occupational Therapy Evaluation Patient Details Name: Tracy Kane MRN: [id number] DOB: 25-Oct-1971 Today's Date: 05/08/2024   History of Present Illness   Pt is a 53 y.o. female with significant PMH of hyperlipidemia, GERD, depression, chronic dizziness presents for chief complaint of intractable dizziness associated nausea and vomiting worsening over the past 3 days.  No clear triggers.  Patient states that she previously takes meclizine  however for the past few days meclizine  has been ineffective and patient has been vomiting with any attempted p.o. intake.     Clinical Impressions Pt was seen for OT evaluation this date. 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 Denies falls.  Pt presents to acute OT demonstrating impaired ADL performance and functional mobility 2/2 mild balance deficits from dizziness. Pt currently requires MOD I to supervision for all bed mobility, STS and mobility in the room with pt edu on slow movements to prevent further dizziness. Pt utilizes good pace and safety awareness. All toileting tasks performed at supervision level, hand hygiene. Pt left seated in recliner. Edu on use of AE/AD for LB ADLs and safety/task simplification for bathing upon return home with dizziness to prevent falls/injury. Pt verbalized understanding.Will follow acutely to maximize safety and independence while minimizing falls risk and promote return to PLOF. Do not anticipate the need for follow up OT services upon acute hospital DC.      If plan is discharge home, recommend the following:   A little help with walking and/or transfers;A little help with bathing/dressing/bathroom     Functional Status Assessment   Patient has had a recent decline in their functional status and demonstrates the ability to make significant improvements in function in a reasonable and predictable  amount of time.     Equipment Recommendations   Other (comment) (long handled equipment)     Recommendations for Other Services         Precautions/Restrictions   Precautions Precautions: Fall Restrictions Weight Bearing Restrictions Per Provider Order: No     Mobility Bed Mobility Overal bed mobility: Modified Independent             General bed mobility comments: increased time-edu to move slower to prevent further dizziness    Transfers Overall transfer level: Needs assistance   Transfers: Sit to/from Stand Sit to Stand: Supervision           General transfer comment: supervision for STS and in room mobility to the bathroom and back without AD      Balance Overall balance assessment: Needs assistance   Sitting balance-Leahy Scale: Good       Standing balance-Leahy Scale: Fair Standing balance comment: supervision, no AD, fall risk d/t dizziness                           ADL either performed or assessed with clinical judgement   ADL Overall ADL's : Needs assistance/impaired     Grooming: Wash/dry hands;Standing;Supervision/safety                   Toilet Transfer: Supervision/safety;Regular Social worker and Hygiene: Supervision/safety;Modified independent;Sitting/lateral lean       Functional mobility during ADLs: Supervision/safety       Vision   Additional Comments: dizziness, at times room spins, then goes away     Perception  Praxis         Pertinent Vitals/Pain Pain Assessment Pain Assessment: Faces Faces Pain Scale: Hurts little more Pain Location: headache Pain Descriptors / Indicators: Headache Pain Intervention(s): Monitored during session, Repositioned     Extremity/Trunk Assessment Upper Extremity Assessment Upper Extremity Assessment: Overall WFL for tasks assessed   Lower Extremity Assessment Lower Extremity Assessment: Overall WFL for tasks  assessed       Communication Communication Communication: No apparent difficulties   Cognition Arousal: Alert Behavior During Therapy: WFL for tasks assessed/performed Cognition: No apparent impairments                               Following commands: Intact       Cueing  General Comments      orthostatic BP checked and stable   Exercises Other Exercises Other Exercises: Edu on role of OT in acute setting, avoiding quick or turning movements that may stimulate dizziness/spinning and use of AE/AD for LB ADLs as pt reports she does not feel safe to do any bending at this time with her dizziness.   Shoulder Instructions      Home Living Family/patient expects to be discharged to:: Private residence Living Arrangements: Alone Available Help at Discharge: Family;Available PRN/intermittently (one daughter local) Type of Home: House Home Access: Stairs to enter [employer] of Steps: 5-6 Entrance Stairs-Rails: Right;Left;Can reach both Home Layout: One level     Bathroom Shower/Tub: Chief Strategy Officer: Standard     Home Equipment: None          Prior Functioning/Environment Prior Level of Function : Independent/Modified Independent;Working/employed;Driving             Mobility Comments: IND, denies falls ADLs Comments: IND, 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, drives    OT Problem List: Decreased activity tolerance;Impaired balance (sitting and/or standing)   OT Treatment/Interventions: Self-care/ADL training;Therapeutic exercise;Therapeutic activities;Patient/family education      OT Goals(Current goals can be found in the care plan section)   Acute Rehab OT Goals Patient Stated Goal: return home and feel better OT Goal Formulation: With patient Time For Goal Achievement: 05/22/24 Potential to Achieve Goals: Good ADL Goals Pt Will Perform Lower Body Bathing: with modified  independence;sitting/lateral leans;with adaptive equipment;sit to/from stand Pt Will Perform Lower Body Dressing: with modified independence;sitting/lateral leans;sit to/from stand;with adaptive equipment   OT Frequency:  Min 1X/week    Co-evaluation              AM-PAC OT "6 Clicks" Daily Activity     Outcome Measure Help from another person eating meals?: None Help from another person taking care of personal grooming?: None Help from another person toileting, which includes using toliet, bedpan, or urinal?: None Help from another person bathing (including washing, rinsing, drying)?: A Little Help from another person to put on and taking off regular upper body clothing?: None Help from another person to put on and taking off regular lower body clothing?: A Little 6 Click Score: 22   End of Session Nurse Communication: Mobility status  Activity Tolerance: Patient tolerated treatment well Patient left: in chair;with call bell/phone within reach  OT Visit Diagnosis: Other abnormalities of gait and mobility (R26.89)                Time: 0981-1914 OT Time Calculation (min): 50 min Charges:  OT General Charges $OT Visit: 1 Visit OT Evaluation $  OT Eval Low Complexity: 1 Low OT Treatments $Self Care/Home Management : 8-22 mins Alix Lahmann, OTR/L  05/08/24, 4:08 PM  Kealii Thueson E Radiah Lubinski 05/08/2024, 4:04 PM

## 2024-05-08 NOTE — ED Notes (Signed)
 [place]): [phone]

## 2024-05-08 NOTE — ED Provider Notes (Signed)
 [facility] Provider Note    Event Date/Time   First MD Initiated Contact with Patient 05/08/24 [phone]     (approximate)   History   Dizziness   HPI  Tracy Kane is a 53 y.o. female with history of hyperlipidemia, anxiety, vertigo who presents to the emergency department with complaints of vertigo over the past several days.  Took her meclizine  tonight without relief and vomited.  Complaining of headache, neck pain worse with turning the head.  No head injury.  States she has had similar headaches but not the neck pain.  Denies numbness, tingling or weakness.  No chest pain but has had shortness of breath.  No fevers or cough.  No lower extremity swelling or discomfort.  She does report having some drainage from the right ear yesterday.  Denies any diarrhea.   History provided by patient.    Past Medical History:  Diagnosis Date   Anxiety    Arthritis    Depression    Hyperlipidemia     Past Surgical History:  Procedure Laterality Date   CESAREAN SECTION     LAPAROSCOPIC GASTRIC SLEEVE RESECTION  05/2014   TUBAL LIGATION      MEDICATIONS:  Prior to Admission medications   Medication Sig Start Date End Date Taking? Authorizing Provider  atorvastatin  (LIPITOR) 20 MG tablet Take 1 tablet (20 mg total) by mouth daily. 01/31/24   Carollynn Cirri, NP  cyclobenzaprine  (FLEXERIL ) 10 MG tablet Take 1 tablet (10 mg total) by mouth 3 (three) times daily as needed for muscle spasms. Patient not taking: Reported on 01/24/2024 06/25/23   Carollynn Cirri, NP  escitalopram  (LEXAPRO ) 20 MG tablet Take 1 tablet (20 mg total) by mouth daily. 06/25/23   Carollynn Cirri, NP  meclizine  (ANTIVERT ) 25 MG tablet Take 1 tablet (25 mg total) by mouth 3 (three) times daily as needed for dizziness. 01/13/24   Carollynn Cirri, NP  metroNIDAZOLE  (METROGEL ) 0.75 % vaginal gel Place 1 Applicatorful vaginally 2 (two) times daily. 02/04/24   Carollynn Cirri, NP  omeprazole  (PRILOSEC)  20 MG capsule Take 1 capsule (20 mg total) by mouth daily as needed. 06/25/23   Carollynn Cirri, NP    Physical Exam   Triage Vital Signs: ED Triage Vitals [05/08/24 0338]  Encounter Vitals Group     BP      Systolic BP Percentile      Diastolic BP Percentile      Pulse      Resp      Temp      Temp src      SpO2      Weight 245 lb (111.1 kg)     Height 5\' 2"  (1.575 m)     Head Circumference      Peak Flow      Pain Score      Pain Loc      Pain Education      Exclude from Growth Chart     Most recent vital signs: Vitals:   05/08/24 0344  BP: (!) 159/106  Pulse: 88  Resp: 17  Temp: 98.3 F (36.8 C)  SpO2: 98%    CONSTITUTIONAL: Alert, responds appropriately to questions. Well-appearing; well-nourished HEAD: Normocephalic, atraumatic EYES: Conjunctivae clear, pupils appear equal, sclera nonicteric ENT: normal nose; moist mucous membranes; TMs are clear bilaterally without erythema, purulence, bulging, perforation, effusion.  No cerumen impaction or sign of foreign body in the external auditory canal. No  inflammation, erythema or drainage from the external auditory canal. No signs of mastoiditis. No pain with manipulation of the pinna bilaterally. NECK: Supple, normal ROM, no midline spinal tenderness or step-off or deformity.  She is tender over the bilateral paraspinal muscles and has a hard time turning her head without pain, vertigo. CARD: RRR; S1 and S2 appreciated RESP: Normal chest excursion without splinting or tachypnea; breath sounds clear and equal bilaterally; no wheezes, no rhonchi, no rales, no hypoxia or respiratory distress, speaking full sentences ABD/GI: Non-distended; soft, non-tender, no rebound, no guarding, no peritoneal signs BACK: The back appears normal EXT: Normal ROM in all joints; no deformity noted, no edema, no calf tenderness or calf swelling SKIN: Normal color for age and race; warm; no rash on exposed skin NEURO: Moves all extremities  equally, normal speech, normal sensation diffusely, cranial nerves II to XII intact, unable to test gait at this time PSYCH: The patient's mood and manner are appropriate.   ED Results / Procedures / Treatments   LABS: (all labs ordered are listed, but only abnormal results are displayed) Labs Reviewed  COMPREHENSIVE METABOLIC PANEL WITH GFR - Abnormal; Notable for the following components:      Result Value   Glucose, Bld 118 (*)    Calcium  8.7 (*)    All other components within normal limits  URINALYSIS, ROUTINE W REFLEX MICROSCOPIC - Abnormal; Notable for the following components:   Color, Urine STRAW (*)    APPearance CLEAR (*)    Glucose, UA 50 (*)    All other components within normal limits  CBC  PREGNANCY, URINE  TROPONIN I (HIGH SENSITIVITY)  TROPONIN I (HIGH SENSITIVITY)     EKG:  EKG Interpretation Date/Time:  Friday May 08 2024 03:44:40 EDT Ventricular Rate:  87 PR Interval:  208 QRS Duration:  96 QT Interval:  391 QTC Calculation: 471 R Axis:   -20  Text Interpretation: Sinus rhythm Borderline prolonged PR interval Borderline left axis deviation Abnormal R-wave progression, early transition ST elevation, consider inferior injury No significant change since last tracing Confirmed by Verneda Golder [phone]) on 05/08/2024 3:54:29 AM         RADIOLOGY: My personal review and interpretation of imaging: CT head and neck unremarkable.  Chest x-ray clear.  I have personally reviewed all radiology reports.   CT ANGIO HEAD NECK W WO CM Result Date: 05/08/2024 CLINICAL DATA:  53 year old female with neurologic deficit, intermittent dizziness for 3 days. Frontal headache, neck pain, nausea vomiting. EXAM: CT ANGIOGRAPHY HEAD AND NECK WITH AND WITHOUT CONTRAST TECHNIQUE: Multidetector CT imaging of the head and neck was performed using the standard protocol during bolus administration of intravenous contrast. Multiplanar CT image reconstructions and MIPs were obtained to  evaluate the vascular anatomy. Carotid stenosis measurements (when applicable) are obtained utilizing NASCET criteria, using the distal internal carotid diameter as the denominator. RADIATION DOSE REDUCTION: This exam was performed according to the departmental dose-optimization program which includes automated exposure control, adjustment of the mA and/or kV according to patient size and/or use of iterative reconstruction technique. CONTRAST:  75mL OMNIPAQUE IOHEXOL 350 MG/ML SOLN COMPARISON:  None Available. FINDINGS: CT HEAD Brain: Cerebral volume is within normal limits for age. No midline shift, ventriculomegaly, mass effect, evidence of mass lesion, intracranial hemorrhage or evidence of cortically based acute infarction. Gray-white matter differentiation is within normal limits throughout the brain. Calvarium and skull base: Intact, negative. Paranasal sinuses: Tympanic cavities, paranasal sinuses and mastoids are well aerated. Orbits: Visualized orbits and  scalp soft tissues are within normal limits. CTA NECK Skeleton: Mild for age cervical spine degeneration, C5-C6 disc space loss and endplate spurring. No acute osseous abnormality identified. Upper chest: Negative; mild respiratory motion. Other neck: Nonvascular neck soft tissue spaces are within normal limits. Aortic arch: 4 vessel arch, the left vertebral artery arises directly from the arch. No arch atherosclerosis. Right carotid system: Dense left subclavian contrast streak. Brachiocephalic artery and proximal right CCA appear normal. Negative right carotid bifurcation. Tortuous right ICA without plaque or stenosis. Left carotid system: Left subclavian venous streak artifact. Proximal left CCA appears to remain normal. Mild left CCA tortuosity. Negative left carotid bifurcation. Negative cervical left ICA. Vertebral arteries: Proximal right subclavian artery and right vertebral artery origin appear normal. Right vertebral artery appears patent and  within normal limits to the skull base. Left vertebral artery arises directly from the arch, tortuous left V1 segment. Late entry into the cervical transverse foramen on series 12, image 248. Codominant left vertebral artery than patent and within normal limits to the skull base. CTA HEAD Posterior circulation: Codominant distal vertebral arteries, vertebrobasilar junction, left PICA origin, dominant appearing right AICA origin, basilar artery, SCA and PCA origins are patent and within normal limits. Posterior communicating arteries are diminutive or absent. Bilateral PCA branches are within normal limits. Anterior circulation: Both ICA siphons are patent. No siphon plaque or stenosis. There does appear to be a normal right posterior communicating artery with associated small infundibulum on series 14, image 83 (normal variant). Patent carotid termini. Normal MCA and ACA origins. Mildly tortuous A1 segments. Normal anterior communicating artery. Bilateral ACA branches are within normal limits. MCA M1 segments and bilateral MCA branches are within normal limits. Venous sinuses: Early contrast timing, not well evaluated. Anatomic variants: Codominant left vertebral artery arises directly from the arch. Review of the MIP images confirms the above findings IMPRESSION: 1. Negative CTA Head and Neck.  Mild arterial tortuosity. 2. Normal for age CT appearance of the brain. Electronically Signed   By: Marlise Simpers M.D.   On: 05/08/2024 05:46   DG Chest Portable 1 View Result Date: 05/08/2024 CLINICAL DATA:  53 year old female with shortness of breath, dizziness. Nausea vomiting. Head and neck pain. EXAM: PORTABLE CHEST 1 VIEW COMPARISON:  Chest radiograph 05/18/2017. FINDINGS: Portable AP upright view at 0420 hours. Mild to moderate chronic cardiomegaly does not appear significantly changed. Other mediastinal contours are within normal limits. Visualized tracheal air column is within normal limits. Lower lung volumes.  Bibasilar hypo ventilation. No superimposed pneumothorax, pulmonary edema, air bronchograms. No acute osseous abnormality identified. Paucity of bowel gas in visible abdomen. IMPRESSION: Cardiomegaly stable since 2018. Lower lung volumes with hypoventilation at the bases. Small pleural effusions would be difficult to exclude, but no pulmonary edema or other acute cardiopulmonary abnormality. Electronically Signed   By: Marlise Simpers M.D.   On: 05/08/2024 04:26     PROCEDURES:  Critical Care performed: No     .1-3 Lead EKG Interpretation  Performed by: Reid Regas, Clover Dao, DO Authorized by: Steadman Prosperi, Clover Dao, DO     Interpretation: normal     ECG rate:  88   ECG rate assessment: normal     Rhythm: sinus rhythm     Ectopy: none     Conduction: normal       IMPRESSION / MDM / ASSESSMENT AND PLAN / ED COURSE  I reviewed the triage vital signs and the nursing notes.    Patient here with vertigo, headache and  neck pain, shortness of breath.  The patient is on the cardiac monitor to evaluate for evidence of arrhythmia and/or significant heart rate changes.   DIFFERENTIAL DIAGNOSIS (includes but not limited to):   Peripheral vertigo, stroke, dissection, intracranial hemorrhage, complex migraine, less likely CVT, meningitis.  Differential also includes anemia, electrolyte derangement, ACS, CHF, pneumonia.   Patient's presentation is most consistent with acute presentation with potential threat to life or bodily function.   PLAN: Will obtain labs, urine, CT of the head and neck, chest x-ray.  Will give medications for symptomatic relief.  EKG nonischemic.   MEDICATIONS GIVEN IN ED: Medications  sodium chloride  flush (NS) 0.9 % injection 3 mL (3 mLs Intravenous Not Given 05/08/24 0429)  ketorolac  (TORADOL ) 30 MG/ML injection 30 mg (30 mg Intravenous Not Given 05/08/24 0429)  ondansetron  (ZOFRAN ) injection 4 mg (has no administration in time range)  sodium chloride  0.9 % bolus 1,000 mL (has no  administration in time range)  diazepam  (VALIUM ) injection 2.5 mg (has no administration in time range)  sodium chloride  0.9 % bolus 1,000 mL (1,000 mLs Intravenous New Bag/Given 05/08/24 0436)  LORazepam  (ATIVAN ) injection 1 mg (1 mg Intravenous Given 05/08/24 0436)  ondansetron  (ZOFRAN ) injection 4 mg (4 mg Intravenous Given 05/08/24 0436)  meclizine  (ANTIVERT ) tablet 25 mg (25 mg Oral Given 05/08/24 0436)  iohexol  (OMNIPAQUE ) 350 MG/ML injection 75 mL (75 mLs Intravenous Contrast Given 05/08/24 0510)     ED COURSE:  6:43 AM  Pt's labs show normal hemoglobin, electrolytes.  Troponin x 2 negative.  Urine shows no sign of infection or dehydration.  CTA head and neck reviewed and interpreted by myself and the radiologist and show no acute abnormality.  Chest x-ray shows stable cardiomegaly.  Patient reports still feeling very symptomatic, unable to turn her head due to vertigo.  Still vomiting.  Will give additional IV fluids, Zofran , IV Valium .  Will obtain MRI brain to rule out stroke.  Patient outside of tPA window.  Will sign out the oncoming ED provider at 7 AM.  Discussed with patient if MRI shows stroke that she will need admission.  If MRI is negative but she is still symptomatic and unable to tolerate p.o. or ambulate she will need admission.  She is comfortable with this plan.   CONSULTS: Pending further workup.   OUTSIDE RECORDS REVIEWED: Reviewed last internal medicine note on 01/24/2024.       FINAL CLINICAL IMPRESSION(S) / ED DIAGNOSES   Final diagnoses:  Vertigo  Torticollis  Shortness of breath     Rx / DC Orders   ED Discharge Orders     None        Note:  This document was prepared using Dragon voice recognition software and may include unintentional dictation errors.   Alvine Mostafa, Clover Dao, DO 05/08/24 [phone]

## 2024-05-08 NOTE — ED Notes (Signed)
 Pt back from MR.

## 2024-05-08 NOTE — Progress Notes (Signed)
 Per Dr Georgeann Oppenheim, dc [facility] order

## 2024-05-08 NOTE — ED Triage Notes (Signed)
 Pt to triage via w/c; reports intermittent dizziness x 3 days accomp by N/V, frontal HA and neck pain; st hx vertigo and has meclizine --took ds at 1am but vomited after

## 2024-05-08 NOTE — ED Notes (Signed)
 Pt ambulated to bedside commode with x1 assist. Pt reports that her "spinning" sensation intensifies with movement. Pt returned to bed without incident. Orange juice and crackers given per request. Scrub bottoms provided. Daughter at bed. NAD. Fall bundle implemented.

## 2024-05-09 DIAGNOSIS — R55 Syncope and collapse: Secondary | ICD-10-CM | POA: Diagnosis not present

## 2024-05-09 DIAGNOSIS — R42 Dizziness and giddiness: Secondary | ICD-10-CM | POA: Diagnosis not present

## 2024-05-09 LAB — CBC
HCT: 39.4 % (ref 36.0–46.0)
Hemoglobin: 12.7 g/dL (ref 12.0–15.0)
MCH: 30.1 pg (ref 26.0–34.0)
MCHC: 32.2 g/dL (ref 30.0–36.0)
MCV: 93.4 fL (ref 80.0–100.0)
Platelets: 414 10*3/uL — ABNORMAL HIGH (ref 150–400)
RBC: 4.22 MIL/uL (ref 3.87–5.11)
RDW: 13.2 % (ref 11.5–15.5)
WBC: 7.4 10*3/uL (ref 4.0–10.5)
nRBC: 0 % (ref 0.0–0.2)

## 2024-05-09 LAB — BASIC METABOLIC PANEL WITH GFR
Anion gap: 5 (ref 5–15)
BUN: 7 mg/dL (ref 6–20)
CO2: 29 mmol/L (ref 22–32)
Calcium: 8.6 mg/dL — ABNORMAL LOW (ref 8.9–10.3)
Chloride: 104 mmol/L (ref 98–111)
Creatinine, Ser: 0.56 mg/dL (ref 0.44–1.00)
GFR, Estimated: >60 mL/min (ref >60)
Glucose, Bld: 90 mg/dL (ref 70–99)
Potassium: 4 mmol/L (ref 3.5–5.1)
Sodium: 138 mmol/L (ref 135–145)

## 2024-05-09 MED ORDER — MECLIZINE HCL 25 MG PO TABS: 25.0000 mg | ORAL_TABLET | Freq: Three times a day (TID) | ORAL | 0 refills | Status: AC

## 2024-05-09 MED ORDER — ONDANSETRON HCL 4 MG PO TABS: 4.0000 mg | ORAL_TABLET | Freq: Four times a day (QID) | ORAL | 0 refills | Status: AC | PRN

## 2024-05-09 NOTE — TOC Transition Note (Signed)
 Transition of Care [facility]) - Discharge Note   Patient Details  Name: Tracy Kane MRN: [id number] Date of Birth: 04-10-1971  [facility]) CM/SW Contact:  Alexandra Ice, RN Phone Number: 05/09/2024, 8:38 AM   Clinical Narrative:    Patient from home, lives alone. Independent with ADLs, no DME at home. She has PCP in the community. No TOC needs identified, will continue to follow.   Barriers to Discharge: Barriers Resolved   Patient Goals and CMS Choice        Expected Discharge Plan and Services           Expected Discharge Date: 05/09/24                 DME Agency: NA       HH Arranged: NA          Prior Living Arrangements/Services                       Activities of Daily Living   ADL Screening (condition at time of admission) Independently performs ADLs?: No Does the patient have a NEW difficulty with bathing/dressing/toileting/self-feeding that is expected to last >3 days?: No Does the patient have a NEW difficulty with getting in/out of bed, walking, or climbing stairs that is expected to last >3 days?: Yes (Initiates electronic notice to provider for possible PT consult) Does the patient have a NEW difficulty with communication that is expected to last >3 days?: No Is the patient deaf or have difficulty hearing?: No Does the patient have difficulty seeing, even when wearing glasses/contacts?: No Does the patient have difficulty concentrating, remembering, or making decisions?: No  Permission Sought/Granted                  Emotional Assessment              Admission diagnosis:  Shortness of breath [R06.02] Vertigo [R42] Torticollis [M43.6] Postural dizziness with near syncope [R42, R55] Patient Active Problem List   Diagnosis Date Noted   Postural dizziness with near syncope 05/08/2024   Thrombocytosis 05/21/2022   Pure hypercholesterolemia 05/17/2022   GERD (gastroesophageal reflux disease) 09/04/2021    Osteoarthritis 09/04/2021   Class 3 severe obesity due to excess calories with body mass index (BMI) of 40.0 to 44.9 in adult 02/16/2021   Anxiety and depression 02/16/2021   PCP:  Carollynn Cirri, NP Pharmacy:   [facility] [address] ([address]), [place] - [address] [place] ([place]) [place] [address] Phone: [phone] Fax: [phone]     Social Determinants of Health (SDOH) Interventions    Readmission Risk Interventions     No data to display            Final next level of care: Home/Self Care Barriers to Discharge: Barriers Resolved   Patient Goals and CMS Choice            Discharge Placement                  Name of family member notified: Family Patient and family notified of of transfer: 05/09/24  Discharge Plan and Services Additional resources added to the After Visit Summary for                    DME Agency: NA       HH Arranged: NA          Social Drivers of Health (SDOH) Interventions  SDOH Screenings   Food Insecurity: No Food Insecurity (05/08/2024)  Housing: Low Risk  (05/08/2024)  Transportation Needs: No Transportation Needs (05/08/2024)  Utilities: Not At Risk (05/08/2024)  Alcohol Screen: Low Risk  (06/25/2023)  Depression (PHQ2-9): High Risk (06/25/2023)  Social Connections: Patient Declined (05/08/2024)  Tobacco Use: Low Risk  (05/08/2024)     Readmission Risk Interventions     No data to display

## 2024-05-09 NOTE — Discharge Summary (Signed)
 Physician Discharge Summary  Tracy Kane ZOX:[id number] DOB: [birth year] DOA: 05/08/2024  PCP: Carollynn Cirri, NP  Admit date: 05/08/2024 Discharge date: 05/09/2024  Admitted From: Home Disposition:  Home  Recommendations for Outpatient Follow-up:  Follow up with PCP in 1-2 weeks Ambulatory referral outpatient physical therapy  Home Health: No Equipment/Devices: None  Discharge Condition: Stable CODE STATUS: Full Diet recommendation: Regular  Brief/Interim Summary:  53 y.o. female with medical history significant of hyperlipidemia, GERD, depression, chronic dizziness presents for chief complaint of intractable dizziness associated nausea and vomiting worsening over the past 3 days.  No clear triggers.  Patient states that she previously takes meclizine  however for the past few days meclizine  has been ineffective and patient has been vomiting with any attempted p.o. intake.   ED Course: Patient received bolus of IV fluids and several doses of Antivert , Zofran , Valium , Ativan .  Symptoms persisted.  MRI imaging survey negative.  No evidence of CVA or aneurysm.  Persistent symptoms of unclear etiology.  Hospitalist contacted for admission.   Patient was maintained on intravenous fluids and scheduled meclizine  25 3 times daily.  Seen by physical therapy.  Physical exam consistent with BPPV.  Recommend outpatient ambulatory referral to physical therapy for vestibular services.  Place at time of discharge.  Patient stable at time of DC.  Tolerating p.o. intake.  Will recommend short course of scheduled meclizine  25 3 times daily and as needed p.o. Zofran .  Stable for discharge home.  Discharge Diagnoses:  Principal Problem:   Postural dizziness with near syncope  BPPV Intractable nausea and vomiting Symptoms improved at time of discharge.  Physical therapy evaluation consistent with BPPV.  Tolerating p.o. intake at time of discharge.  Recommend short course of scheduled meclizine  25 3  times daily and as needed Zofran .  Outpatient referral to physical therapy for vestibular services placed at time of discharge.  Discharge Instructions  Discharge Instructions     Ambulatory referral to Physical Therapy   Complete by: As directed    Vestibular therapy for BPPV   Diet - low sodium heart healthy   Complete by: As directed    Increase activity slowly   Complete by: As directed       Allergies as of 05/09/2024   No Known Allergies      Medication List     STOP taking these medications    metroNIDAZOLE  0.75 % vaginal gel Commonly known as: METROGEL        TAKE these medications    atorvastatin  20 MG tablet Commonly known as: LIPITOR Take 1 tablet (20 mg total) by mouth daily.   escitalopram  20 MG tablet Commonly known as: Lexapro  Take 1 tablet (20 mg total) by mouth daily.   meclizine  25 MG tablet Commonly known as: ANTIVERT  Take 1 tablet (25 mg total) by mouth 3 (three) times daily for 14 days. What changed:  when to take this reasons to take this   omeprazole  20 MG capsule Commonly known as: PRILOSEC Take 1 capsule (20 mg total) by mouth daily as needed.   ondansetron  4 MG tablet Commonly known as: ZOFRAN  Take 1 tablet (4 mg total) by mouth every 6 (six) hours as needed for nausea.        Follow-up Information     Carollynn Cirri, NP Follow up.   Specialties: Internal Medicine, Emergency Medicine Why: Hospital follow up Contact information: [address] [place] [address] [phone]  No Known Allergies  Consultations: None   Procedures/Studies: MR BRAIN WO CONTRAST Result Date: 05/08/2024 CLINICAL DATA:  52 year old female with neurologic deficit, intermittent dizziness for 3 days. Frontal headache, neck pain, nausea vomiting. EXAM: MRI HEAD WITHOUT CONTRAST TECHNIQUE: Multiplanar, multiecho pulse sequences of the brain and surrounding structures were obtained without intravenous contrast. COMPARISON:   CT head, CTA head and neck 0513 hours today. FINDINGS: Brain: Normal cerebral volume. Cavum vergae, normal variant. Other midline structures appear normally formed. No restricted diffusion to suggest acute infarction. No midline shift, mass effect, evidence of mass lesion, ventriculomegaly, extra-axial collection or acute intracranial hemorrhage. Cervicomedullary junction and pituitary are within normal limits. Martina Sledge and white matter signal is normal for age throughout the brain. No encephalomalacia or chronic cerebral blood products identified. Vascular: Major intracranial vascular flow voids are preserved. Skull and upper cervical spine: Negative visible cervical spine. Background bone marrow signal is mildly heterogeneous, but no suspicious osseous lesion is identified. Sinuses/Orbits: Negative. Other: Grossly normal visible internal auditory structures. Negative visible scalp and face. IMPRESSION: Normal noncontrast MRI appearance of the Brain. Electronically Signed   By: Marlise Simpers M.D.   On: 05/08/2024 07:58   CT ANGIO HEAD NECK W WO CM Result Date: 05/08/2024 CLINICAL DATA:  53 year old female with neurologic deficit, intermittent dizziness for 3 days. Frontal headache, neck pain, nausea vomiting. EXAM: CT ANGIOGRAPHY HEAD AND NECK WITH AND WITHOUT CONTRAST TECHNIQUE: Multidetector CT imaging of the head and neck was performed using the standard protocol during bolus administration of intravenous contrast. Multiplanar CT image reconstructions and MIPs were obtained to evaluate the vascular anatomy. Carotid stenosis measurements (when applicable) are obtained utilizing NASCET criteria, using the distal internal carotid diameter as the denominator. RADIATION DOSE REDUCTION: This exam was performed according to the departmental dose-optimization program which includes automated exposure control, adjustment of the mA and/or kV according to patient size and/or use of iterative reconstruction technique. CONTRAST:   75mL OMNIPAQUE  IOHEXOL  350 MG/ML SOLN COMPARISON:  None Available. FINDINGS: CT HEAD Brain: Cerebral volume is within normal limits for age. No midline shift, ventriculomegaly, mass effect, evidence of mass lesion, intracranial hemorrhage or evidence of cortically based acute infarction. Gray-white matter differentiation is within normal limits throughout the brain. Calvarium and skull base: Intact, negative. Paranasal sinuses: Tympanic cavities, paranasal sinuses and mastoids are well aerated. Orbits: Visualized orbits and scalp soft tissues are within normal limits. CTA NECK Skeleton: Mild for age cervical spine degeneration, C5-C6 disc space loss and endplate spurring. No acute osseous abnormality identified. Upper chest: Negative; mild respiratory motion. Other neck: Nonvascular neck soft tissue spaces are within normal limits. Aortic arch: 4 vessel arch, the left vertebral artery arises directly from the arch. No arch atherosclerosis. Right carotid system: Dense left subclavian contrast streak. Brachiocephalic artery and proximal right CCA appear normal. Negative right carotid bifurcation. Tortuous right ICA without plaque or stenosis. Left carotid system: Left subclavian venous streak artifact. Proximal left CCA appears to remain normal. Mild left CCA tortuosity. Negative left carotid bifurcation. Negative cervical left ICA. Vertebral arteries: Proximal right subclavian artery and right vertebral artery origin appear normal. Right vertebral artery appears patent and within normal limits to the skull base. Left vertebral artery arises directly from the arch, tortuous left V1 segment. Late entry into the cervical transverse foramen on series 12, image 248. Codominant left vertebral artery than patent and within normal limits to the skull base. CTA HEAD Posterior circulation: Codominant distal vertebral arteries, vertebrobasilar junction, left PICA origin, dominant appearing right  AICA origin, basilar artery, SCA  and PCA origins are patent and within normal limits. Posterior communicating arteries are diminutive or absent. Bilateral PCA branches are within normal limits. Anterior circulation: Both ICA siphons are patent. No siphon plaque or stenosis. There does appear to be a normal right posterior communicating artery with associated small infundibulum on series 14, image 83 (normal variant). Patent carotid termini. Normal MCA and ACA origins. Mildly tortuous A1 segments. Normal anterior communicating artery. Bilateral ACA branches are within normal limits. MCA M1 segments and bilateral MCA branches are within normal limits. Venous sinuses: Early contrast timing, not well evaluated. Anatomic variants: Codominant left vertebral artery arises directly from the arch. Review of the MIP images confirms the above findings IMPRESSION: 1. Negative CTA Head and Neck.  Mild arterial tortuosity. 2. Normal for age CT appearance of the brain. Electronically Signed   By: Marlise Simpers M.D.   On: 05/08/2024 05:46   DG Chest Portable 1 View Result Date: 05/08/2024 CLINICAL DATA:  53 year old female with shortness of breath, dizziness. Nausea vomiting. Head and neck pain. EXAM: PORTABLE CHEST 1 VIEW COMPARISON:  Chest radiograph 05/18/2017. FINDINGS: Portable AP upright view at 0420 hours. Mild to moderate chronic cardiomegaly does not appear significantly changed. Other mediastinal contours are within normal limits. Visualized tracheal air column is within normal limits. Lower lung volumes. Bibasilar hypo ventilation. No superimposed pneumothorax, pulmonary edema, air bronchograms. No acute osseous abnormality identified. Paucity of bowel gas in visible abdomen. IMPRESSION: Cardiomegaly stable since 2018. Lower lung volumes with hypoventilation at the bases. Small pleural effusions would be difficult to exclude, but no pulmonary edema or other acute cardiopulmonary abnormality. Electronically Signed   By: Marlise Simpers M.D.   On: 05/08/2024 04:26       Subjective: Seen and examined on the day of discharge.  Stable no distress.  Appropriate for discharge home.  Discharge Exam: Vitals:   05/09/24 0351 05/09/24 0756  BP: (!) 130/91 127/89  Pulse: 82 79  Resp: 17 18  Temp: 97.8 F (36.6 C) 98.8 F (37.1 C)  SpO2: 97% 96%   Vitals:   05/08/24 2124 05/08/24 2351 05/09/24 0351 05/09/24 0756  BP: 103/72 107/70 (!) 130/91 127/89  Pulse: 89 77 82 79  Resp: 16 14 17 18   Temp:  98.6 F (37 C) 97.8 F (36.6 C) 98.8 F (37.1 C)  TempSrc:    Oral  SpO2: 94% 97% 97% 96%  Weight:      Height:        General: Pt is alert, awake, not in acute distress Cardiovascular: RRR, S1/S2 +, no rubs, no gallops Respiratory: CTA bilaterally, no wheezing, no rhonchi Abdominal: Soft, NT, ND, bowel sounds + Extremities: no edema, no cyanosis    The results of significant diagnostics from this hospitalization (including imaging, microbiology, ancillary and laboratory) are listed below for reference.     Microbiology: No results found for this or any previous visit (from the past 240 hours).   Labs: BNP (last 3 results) No results for input(s): "BNP" in the last 8760 hours. Basic Metabolic Panel: Recent Labs  Lab 05/08/24 0428 05/09/24 0519  NA 137 138  K 3.9 4.0  CL 105 104  CO2 24 29  GLUCOSE 118* 90  BUN 9 7  CREATININE 0.56 0.56  CALCIUM  8.7* 8.6*   Liver Function Tests: Recent Labs  Lab 05/08/24 0428  AST 19  ALT 16  ALKPHOS 51  BILITOT 0.5  PROT 7.2  ALBUMIN 3.5  No results for input(s): "LIPASE", "AMYLASE" in the last 168 hours. No results for input(s): "AMMONIA" in the last 168 hours. CBC: Recent Labs  Lab 05/08/24 0428 05/09/24 0519  WBC 5.9 7.4  HGB 12.8 12.7  HCT 39.2 39.4  MCV 92.2 93.4  PLT 376 414*   Cardiac Enzymes: No results for input(s): "CKTOTAL", "CKMB", "CKMBINDEX", "TROPONINI" in the last 168 hours. BNP: Invalid input(s): "POCBNP" CBG: No results for input(s): "GLUCAP" in the  last 168 hours. D-Dimer No results for input(s): "DDIMER" in the last 72 hours. Hgb A1c No results for input(s): "HGBA1C" in the last 72 hours. Lipid Profile No results for input(s): "CHOL", "HDL", "LDLCALC", "TRIG", "CHOLHDL", "LDLDIRECT" in the last 72 hours. Thyroid  function studies Recent Labs    05/08/24 0427  TSH 2.452   Anemia work up No results for input(s): "VITAMINB12", "FOLATE", "FERRITIN", "TIBC", "IRON", "RETICCTPCT" in the last 72 hours. Urinalysis    Component Value Date/Time   COLORURINE STRAW (A) 05/08/2024 0534   APPEARANCEUR CLEAR (A) 05/08/2024 0534   LABSPEC 1.011 05/08/2024 0534   PHURINE 7.0 05/08/2024 0534   GLUCOSEU 50 (A) 05/08/2024 0534   HGBUR NEGATIVE 05/08/2024 0534   BILIRUBINUR NEGATIVE 05/08/2024 0534   KETONESUR NEGATIVE 05/08/2024 0534   PROTEINUR NEGATIVE 05/08/2024 0534   NITRITE NEGATIVE 05/08/2024 0534   LEUKOCYTESUR NEGATIVE 05/08/2024 0534   Sepsis Labs Recent Labs  Lab 05/08/24 0428 05/09/24 0519  WBC 5.9 7.4   Microbiology No results found for this or any previous visit (from the past 240 hours).   Time coordinating discharge: Over 30 minutes  SIGNED:   Tiajuana Fluke, MD  [facility] 05/09/2024, 10:51 AM Pager   If 7PM-7AM, please contact night-coverage

## 2024-05-14 ENCOUNTER — Ambulatory Visit (INDEPENDENT_AMBULATORY_CARE_PROVIDER_SITE_OTHER): Admitting: Internal Medicine

## 2024-05-14 ENCOUNTER — Encounter: Payer: Self-pay | Admitting: Internal Medicine

## 2024-05-14 VITALS — BP 132/88 | Ht 62.0 in | Wt 245.0 lb

## 2024-05-14 DIAGNOSIS — H8113 Benign paroxysmal vertigo, bilateral: Secondary | ICD-10-CM

## 2024-05-14 NOTE — Progress Notes (Signed)
 Subjective:    Patient ID: Tracy Kane, female    DOB: 24-Jul-1971, 53 y.o.   MRN: [id number]  HPI  Patient presents the clinic today for hospital follow-up.  She presented to the ER 5/30 with complaint of nausea, vomiting and dizziness for the past 3 days.  Labs showed a stable thrombocytosis and mildly decreased calcium  but otherwise unremarkable.  ECG was unremarkable.  Chest x-ray did not show any evidence of acute finding but stable cardiomegaly since 2018.  CTA of the head and neck showed:  IMPRESSION: 1. Negative CTA Head and Neck.  Mild arterial tortuosity. 2. Normal for age CT appearance of the brain.  MRI of the brain showed:  IMPRESSION: Normal noncontrast MRI appearance of the brain  She was treated with IV fluids, meclizine , zofran , valium  and ativan .  Physical therapy was consulted and she was diagnosed with BPPV.  She was discharged on 5/31 with meclizine  25 mg every 8 hours as needed and a referral to PT for vestibular rehab.  Since that time, she has had intermittent lightheadedness, but no significant dizziness. She is not having any nausea or vomiting. She has not set up her appointment for vestibular rehab.   Review of Systems   Past Medical History:  Diagnosis Date   Anxiety    Arthritis    Depression    Hyperlipidemia     Current Outpatient Medications  Medication Sig Dispense Refill   atorvastatin  (LIPITOR) 20 MG tablet Take 1 tablet (20 mg total) by mouth daily. 90 tablet 1   escitalopram  (LEXAPRO ) 20 MG tablet Take 1 tablet (20 mg total) by mouth daily. 90 tablet 1   meclizine  (ANTIVERT ) 25 MG tablet Take 1 tablet (25 mg total) by mouth 3 (three) times daily for 14 days. 42 tablet 0   omeprazole  (PRILOSEC) 20 MG capsule Take 1 capsule (20 mg total) by mouth daily as needed. 90 capsule 1   ondansetron  (ZOFRAN ) 4 MG tablet Take 1 tablet (4 mg total) by mouth every 6 (six) hours as needed for nausea. 20 tablet 0   No current facility-administered  medications for this visit.    No Known Allergies  Family History  Problem Relation Age of Onset   Breast cancer Paternal Grandmother     Social History   Socioeconomic History   Marital status: Single    Spouse name: Not on file   Number of children: Not on file   Years of education: Not on file   Highest education level: Not on file  Occupational History   Not on file  Tobacco Use   Smoking status: Never   Smokeless tobacco: Never  Vaping Use   Vaping status: Never Used  Substance and Sexual Activity   Alcohol use: Yes    Comment: occasionally   Drug use: No   Sexual activity: Not Currently  Other Topics Concern   Not on file  Social History Narrative   Not on file   Social Drivers of Health   Financial Resource Strain: Not on file  Food Insecurity: No Food Insecurity (05/08/2024)   Hunger Vital Sign    Worried About Running Out of Food in the Last Year: Never true    Ran Out of Food in the Last Year: Never true  Transportation Needs: No Transportation Needs (05/08/2024)   PRAPARE - Administrator, Civil Service (Medical): No    Lack of Transportation (Non-Medical): No  Physical Activity: Not on file  Stress: Not on file  Social Connections: Patient Declined (05/08/2024)   Social Connection and Isolation Panel [NHANES]    Frequency of Communication with Friends and Family: Patient declined    Frequency of Social Gatherings with Friends and Family: Patient declined    Attends Religious Services: Patient declined    Database administrator or Organizations: Patient declined    Attends Banker Meetings: Patient declined    Marital Status: Patient declined  Intimate Partner Violence: Not At Risk (05/08/2024)   Humiliation, Afraid, Rape, and Kick questionnaire    Fear of Current or Ex-Partner: No    Emotionally Abused: No    Physically Abused: No    Sexually Abused: No     Constitutional: Denies fever, malaise, fatigue, headache or  abrupt weight changes.  HEENT: Denies eye pain, eye redness, ear pain, ringing in the ears, wax buildup, runny nose, nasal congestion, bloody nose, or sore throat. Respiratory: Denies difficulty breathing, shortness of breath, cough or sputum production.   Cardiovascular: Denies chest pain, chest tightness, palpitations or swelling in the hands or feet.  Gastrointestinal: Denies abdominal pain, bloating, constipation, diarrhea or blood in the stool.  GU: Denies urgency, frequency, pain with urination, burning sensation, blood in urine, odor or discharge. Musculoskeletal: Patient reports joint pain.  Denies decrease in range of motion, difficulty with gait, muscle pain or joint swelling.  Skin: Denies redness, rashes, lesions or ulcercations.  Neurological: Pt reports lightheadedness.  Denies dizziness, difficulty with memory, difficulty with speech or problems with balance and coordination.  Psych: Patient has a history of anxiety and depression.  Denies SI/HI.  No other specific complaints in a complete review of systems (except as listed in HPI above).      Objective:   Physical Exam  BP 132/88 (BP Location: Right Arm, Patient Position: Sitting, Cuff Size: Large)   Ht 5\' 2"  (1.575 m)   Wt 245 lb (111.1 kg)   BMI 44.81 kg/m   Wt Readings from Last 3 Encounters:  05/08/24 245 lb (111.1 kg)  01/24/24 248 lb 12.8 oz (112.9 kg)  06/25/23 244 lb (110.7 kg)    General: Appears her stated age, obese, in NAD. Skin: Warm, dry and intact.  HEENT: Head: normal shape and size; Eyes: sclera white, no icterus, conjunctiva pink, PERRLA and EOMs intact; Ears: Tm's gray and intact, normal light reflex, no effusions;  Cardiovascular: Normal rate and rhythm. S1,S2 noted.  No murmur, rubs or gallops noted. No JVD or BLE edema.  Pulmonary/Chest: Normal effort and positive vesicular breath sounds. No respiratory distress. No wheezes, rales or ronchi noted.  Musculoskeletal: No difficulty with gait.   Neurological: Alert and oriented. Coordination normal.    BMET    Component Value Date/Time   NA 138 05/09/2024 0519   NA 139 09/05/2021 1414   K 4.0 05/09/2024 0519   CL 104 05/09/2024 0519   CO2 29 05/09/2024 0519   GLUCOSE 90 05/09/2024 0519   BUN 7 05/09/2024 0519   BUN 6 09/05/2021 1414   CREATININE 0.56 05/09/2024 0519   CREATININE 0.69 01/24/2024 0846   CALCIUM  8.6 (L) 05/09/2024 0519   GFRNONAA >60 05/09/2024 0519   GFRAA >60 05/18/2017 1952    Lipid Panel     Component Value Date/Time   CHOL 212 (H) 01/24/2024 0846   CHOL 214 (H) 09/05/2021 1414   TRIG 108 01/24/2024 0846   HDL 58 01/24/2024 0846   HDL 53 09/05/2021 1414   CHOLHDL 3.7 01/24/2024 0846   LDLCALC 132 (H)  01/24/2024 0846    CBC    Component Value Date/Time   WBC 7.4 05/09/2024 0519   RBC 4.22 05/09/2024 0519   HGB 12.7 05/09/2024 0519   HGB 14.0 09/05/2021 1414   HCT 39.4 05/09/2024 0519   HCT 42.1 09/05/2021 1414   PLT 414 (H) 05/09/2024 0519   PLT 402 09/05/2021 1414   MCV 93.4 05/09/2024 0519   MCV 90 09/05/2021 1414   MCH 30.1 05/09/2024 0519   MCHC 32.2 05/09/2024 0519   RDW 13.2 05/09/2024 0519   RDW 12.7 09/05/2021 1414    Hgb A1C Lab Results  Component Value Date   HGBA1C 5.6 01/24/2024            Assessment & Plan:  Hospital follow-up for BPPV:  Hospital notes, labs and imaging reviewed Encourage adequate hydration as this can prevent dizzines Continue meclizine  25 mg every 8 hours as previously prescribed She will consider follow-up with PT for vestibular rehab as recommended if the Epley maneuver is ineffective  RTC in 2 months for follow-up of chronic conditions Helayne Lo, NP

## 2024-05-14 NOTE — Patient Instructions (Signed)
 Benign Positional Vertigo Vertigo is the feeling that you or your surroundings are moving when they are not. Benign positional vertigo is the most common form of vertigo. This is usually a harmless condition (benign). This condition is positional. This means that symptoms are triggered by certain movements and positions. This condition can be dangerous if it occurs while you are doing something that could cause harm to yourself or others. This includes activities such as driving or operating machinery. What are the causes? The inner ear has fluid-filled canals that help your brain sense movement and balance. When the fluid moves, the brain receives messages about your body's position. With benign positional vertigo, calcium crystals in the inner ear break free and disturb the inner ear area. This causes your brain to receive confusing messages about your body's position. What increases the risk? You are more likely to develop this condition if: You are a woman. You are 51 years of age or older. You have recently had a head injury. You have an inner ear disease. What are the signs or symptoms? Symptoms of this condition usually happen when you move your head or your eyes in different directions. Symptoms may start suddenly and usually last for less than a minute. They include: Loss of balance and falling. Feeling like you are spinning or moving. Feeling like your surroundings are spinning or moving. Nausea and vomiting. Blurred vision. Dizziness. Involuntary eye movement (nystagmus). Symptoms can be mild and cause only minor problems, or they can be severe and interfere with daily life. Episodes of benign positional vertigo may return (recur) over time. Symptoms may also improve over time. How is this diagnosed? This condition may be diagnosed based on: Your medical history. A physical exam of the head, neck, and ears. Positional tests to check for or stimulate vertigo. You may be asked to  turn your head and change positions, such as going from sitting to lying down. A health care provider will watch for symptoms of vertigo. You may be referred to a health care provider who specializes in ear, nose, and throat problems (ENT or otolaryngologist) or a provider who specializes in disorders of the nervous system (neurologist). How is this treated?  This condition may be treated in a session in which your health care provider moves your head in specific positions to help the displaced crystals in your inner ear move. Treatment for this condition may take several sessions. Surgery may be needed in severe cases, but this is rare. In some cases, benign positional vertigo may resolve on its own in 2-4 weeks. Follow these instructions at home: Safety Move slowly. Avoid sudden body or head movements or certain positions, as told by your health care provider. Avoid driving or operating machinery until your health care provider says it is safe. Avoid doing any tasks that would be dangerous to you or others if vertigo occurs. If you have trouble walking or keeping your balance, try using a cane for stability. If you feel dizzy or unstable, sit down right away. Return to your normal activities as told by your health care provider. Ask your health care provider what activities are safe for you. General instructions Take over-the-counter and prescription medicines only as told by your health care provider. Drink enough fluid to keep your urine pale yellow. Keep all follow-up visits. This is important. Contact a health care provider if: You have a fever. Your condition gets worse or you develop new symptoms. Your family or friends notice any behavioral changes.  You have nausea or vomiting that gets worse. You have numbness or a prickling and tingling sensation. Get help right away if you: Have difficulty speaking or moving. Are always dizzy or faint. Develop severe headaches. Have weakness in  your legs or arms. Have changes in your hearing or vision. Develop a stiff neck. Develop sensitivity to light. These symptoms may represent a serious problem that is an emergency. Do not wait to see if the symptoms will go away. Get medical help right away. Call your local emergency services (911 in the [place] Do not drive yourself to the hospital. Summary Vertigo is the feeling that you or your surroundings are moving when they are not. Benign positional vertigo is the most common form of vertigo. This condition is caused by calcium crystals in the inner ear that become displaced. This causes a disturbance in an area of the inner ear that helps your brain sense movement and balance. Symptoms include loss of balance and falling, feeling that you or your surroundings are moving, nausea and vomiting, and blurred vision. This condition can be diagnosed based on symptoms, a physical exam, and positional tests. Follow safety instructions as told by your health care provider and keep all follow-up visits. This is important. This information is not intended to replace advice given to you by your health care provider. Make sure you discuss any questions you have with your health care provider. Document Revised: 06/17/2023 Document Reviewed: 06/17/2023 Elsevier Patient Education  2024 [employer].

## 2024-05-21 DIAGNOSIS — Z419 Encounter for procedure for purposes other than remedying health state, unspecified: Secondary | ICD-10-CM | POA: Diagnosis not present

## 2024-05-22 IMAGING — US US PELVIS LIMITED
1 series · 15 of 18 positions shown · non-contrast
Comparison: None Available.

CLINICAL DATA: Right lower quadrant mass

EXAM:
LIMITED ULTRASOUND OF PELVIS
TECHNIQUE: Limited transabdominal ultrasound examination of the pelvis was
performed.

[Series 1: us abdomen limited · 18 acquisitions, 15 frames shown]
[im 1/18]
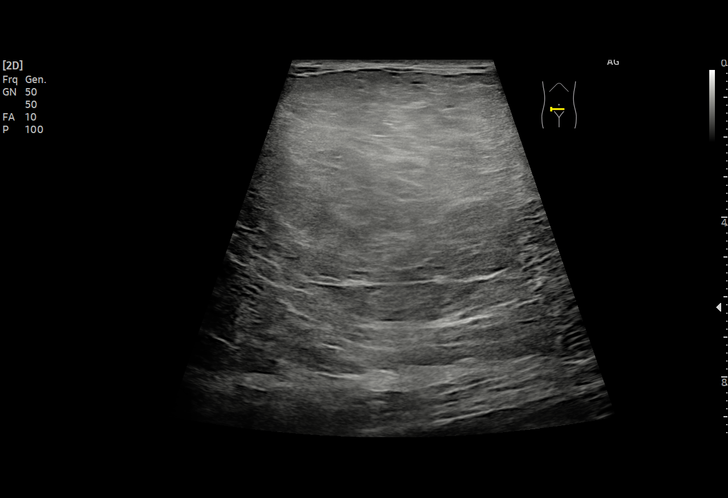
[im 2/18]
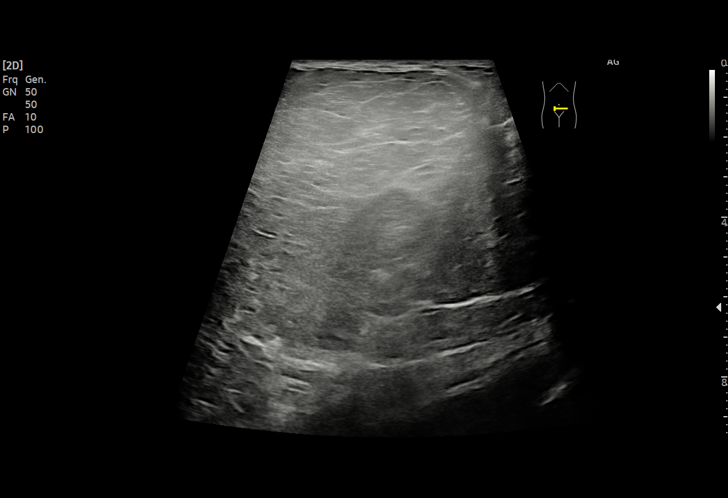
[im 4/18]
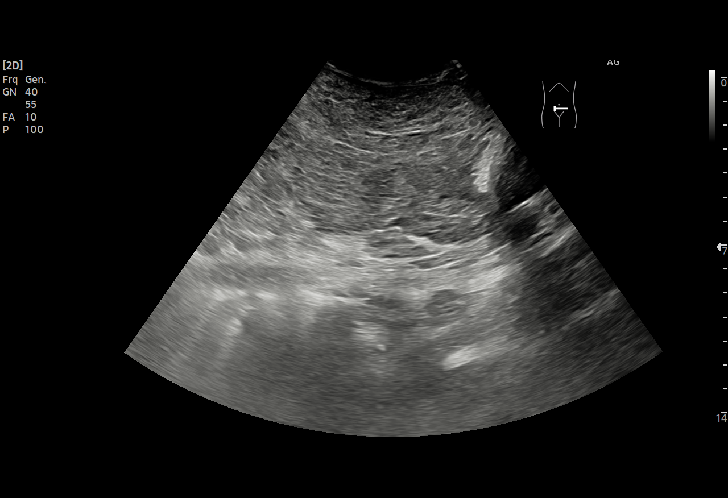
[im 5/18]
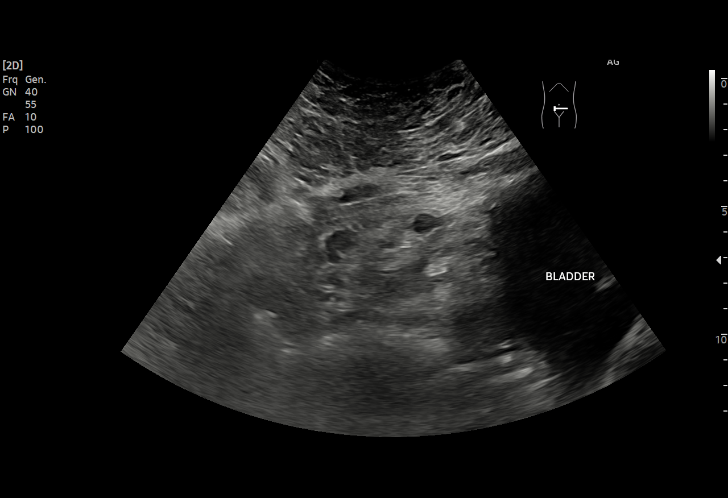
[im 6/18]
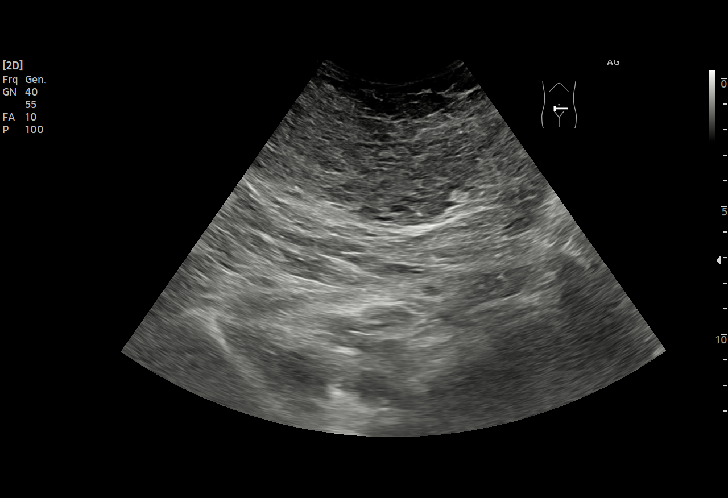
[im 7/18]
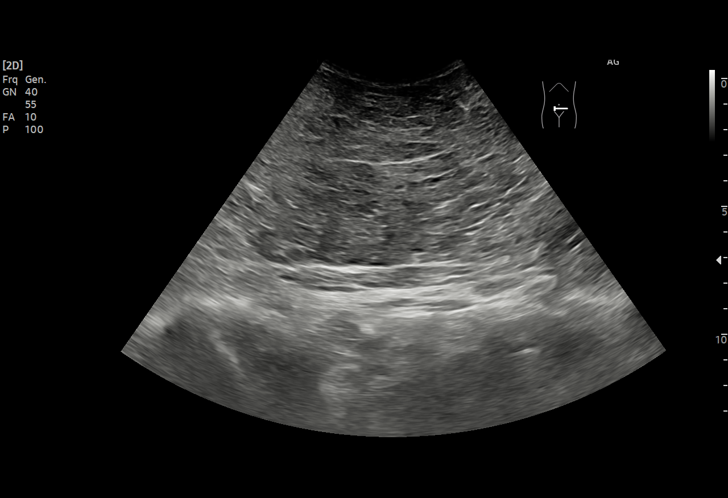
[im 8/18]
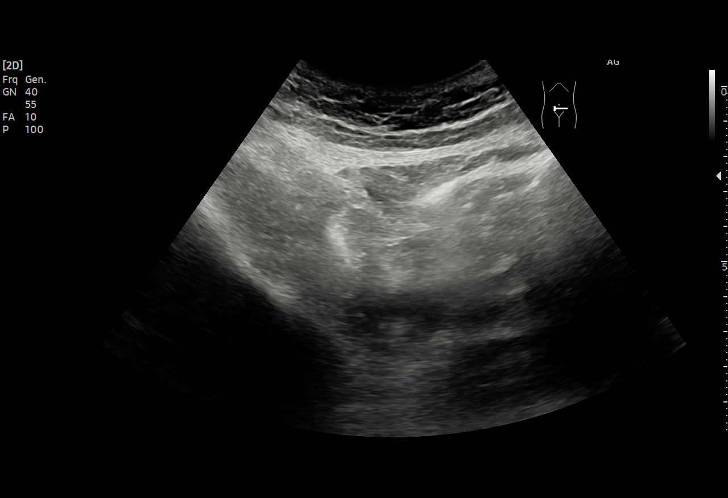
[im 10/18]
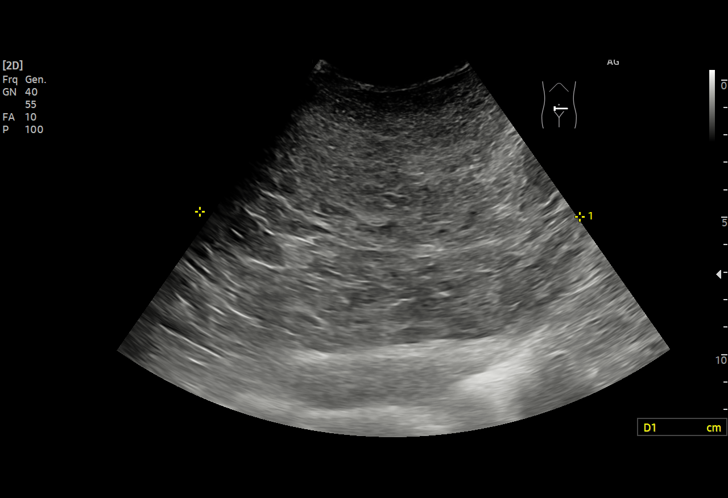
[im 11/18]
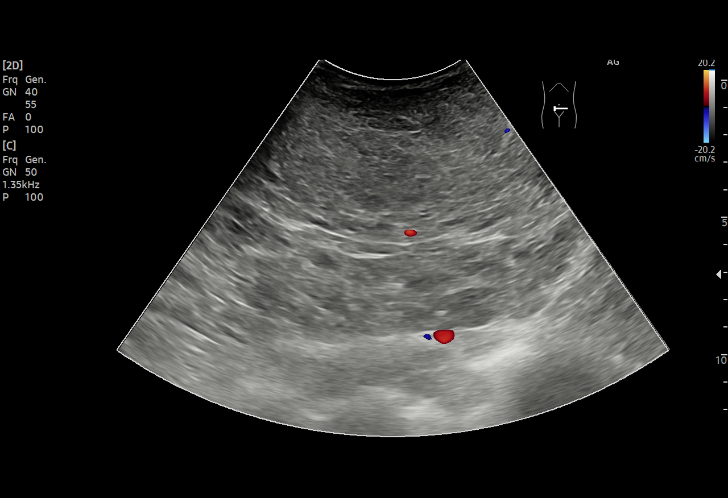
[im 12/18]
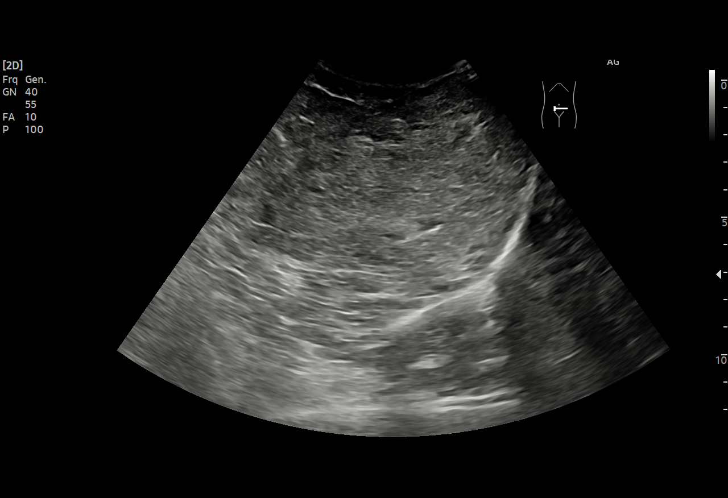
[im 13/18]
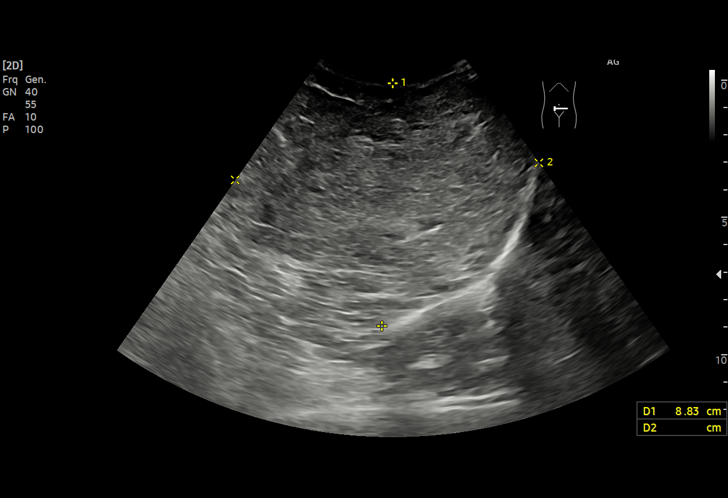
[im 14/18]
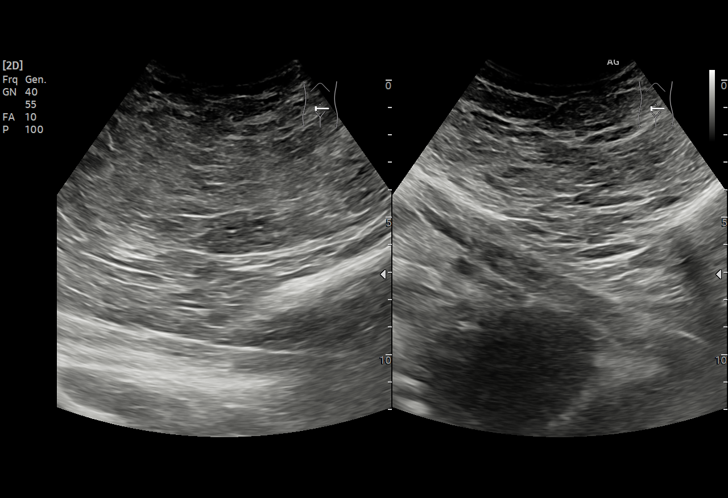
[im 16/18]
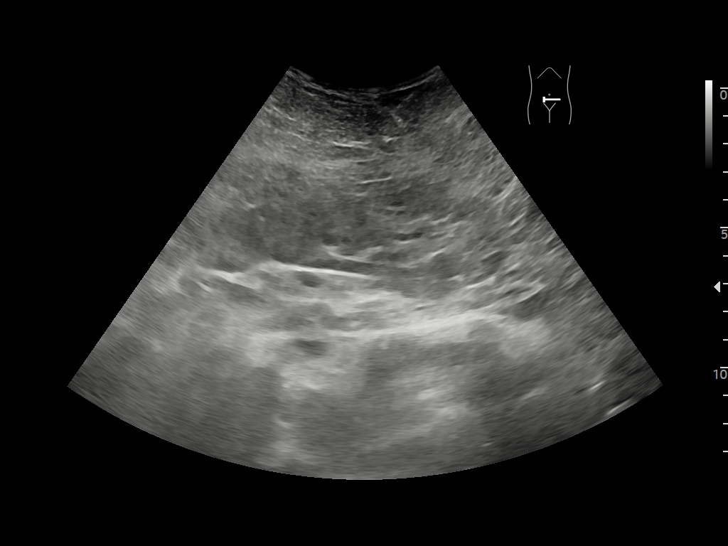
[im 17/18]
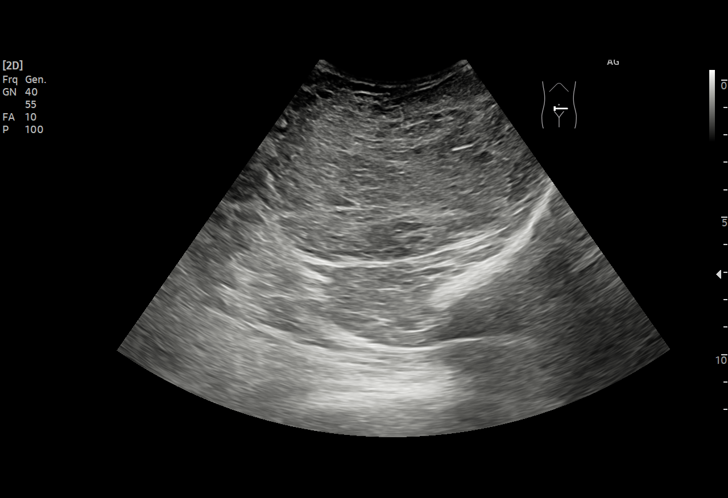
[im 18/18]
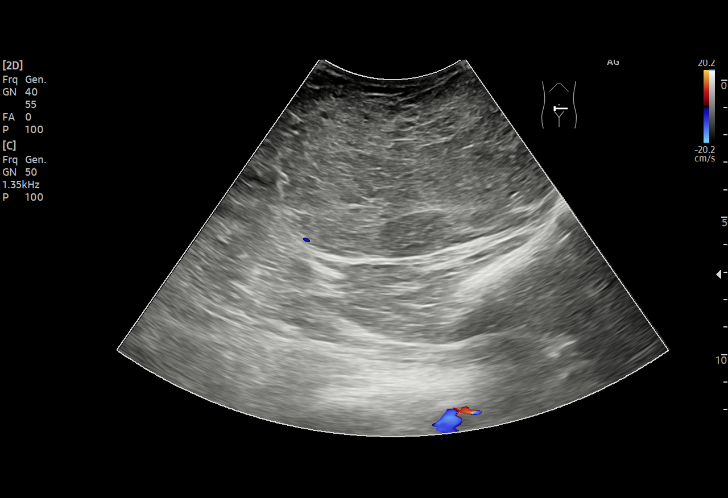

[15 of 18 positions shown; findings below may reference images not displayed]

FINDINGS: Targeted ultrasound of the right lower quadrant is performed with
and without Valsalva. No bowel containing hernia is visualized.
Within the right infraumbilical region and within the subcutaneous
soft tissues, there is a large slightly echogenic mass measuring
11.1 x 8.8 x 13.8 cm.
IMPRESSION: 1. Negative for bowel containing hernia
2. Large right infraumbilical subcutaneous soft tissue mass
measuring 13.8 cm corresponding to the patient's palpable mass, this
is isoechoic to fat and suggests a fat based lesion but given size,
suggest correlation with MRI.

## 2024-05-29 ENCOUNTER — Other Ambulatory Visit: Payer: Self-pay | Admitting: Internal Medicine

## 2024-06-02 NOTE — Telephone Encounter (Signed)
 Requested Prescriptions  Pending Prescriptions Disp Refills   escitalopram  (LEXAPRO ) 20 MG tablet [Pharmacy Med Name: Escitalopram  Oxalate 20 MG Oral Tablet] 90 tablet 1    Sig: Take 1 tablet by mouth once daily     Psychiatry:  Antidepressants - SSRI Passed - 06/02/2024  9:45 AM      Passed - Completed PHQ-2 or PHQ-9 in the last 360 days      Passed - Valid encounter within last 6 months    Recent Outpatient Visits           2 weeks ago Benign paroxysmal positional vertigo due to bilateral vestibular disorder   [place] [facility] [place], Angeline ORN, NP   4 months ago Encounter for general adult medical examination with abnormal findings   [place] [facility] [place], Angeline ORN, NP   4 months ago Dizziness   [place] [facility], Angeline ORN, NP       Future Appointments             In 1 month [place], Angeline ORN, NP [place] [facility], [facility]

## 2024-06-09 ENCOUNTER — Other Ambulatory Visit: Payer: Self-pay | Admitting: Internal Medicine

## 2024-06-11 NOTE — Telephone Encounter (Signed)
 Requested medication (s) are due for refill today: yes  Requested medication (s) are on the active medication list: no  Last refill:  05/09/24  Future visit scheduled: yes  Notes to clinic:  Unable to refill per protocol, Rx expired.      Requested Prescriptions  Pending Prescriptions Disp Refills   meclizine  (ANTIVERT ) 25 MG tablet [Pharmacy Med Name: Meclizine  HCl 25 MG Oral Tablet] 30 tablet 0    Sig: TAKE 1 TABLET BY MOUTH THREE TIMES DAILY AS NEEDED FOR DIZZINESS     Not Delegated - Gastroenterology: Antiemetics Failed - 06/11/2024  2:30 PM      Failed - This refill cannot be delegated      Passed - Valid encounter within last 6 months    Recent Outpatient Visits           4 weeks ago Benign paroxysmal positional vertigo due to bilateral vestibular disorder   [place] [facility] [place], Angeline ORN, NP   4 months ago Encounter for general adult medical examination with abnormal findings   [place] [facility] [place], Angeline ORN, NP   5 months ago Dizziness   [place] [facility] [place], Angeline ORN, NP       Future Appointments             In 1 month [place], Angeline ORN, NP [place] [facility], [facility]

## 2024-06-20 DIAGNOSIS — Z419 Encounter for procedure for purposes other than remedying health state, unspecified: Secondary | ICD-10-CM | POA: Diagnosis not present

## 2024-07-21 ENCOUNTER — Ambulatory Visit: Payer: Self-pay

## 2024-07-21 ENCOUNTER — Other Ambulatory Visit: Payer: Self-pay | Admitting: Internal Medicine

## 2024-07-21 DIAGNOSIS — R42 Dizziness and giddiness: Secondary | ICD-10-CM

## 2024-07-21 DIAGNOSIS — Z419 Encounter for procedure for purposes other than remedying health state, unspecified: Secondary | ICD-10-CM | POA: Diagnosis not present

## 2024-07-21 MED ORDER — MECLIZINE HCL 25 MG PO TABS: 25.0000 mg | ORAL_TABLET | Freq: Three times a day (TID) | ORAL | 0 refills | Status: DC | PRN

## 2024-07-21 NOTE — Telephone Encounter (Signed)
 Spoke with patient, explained to her to take 50 mg every 8 hour of the meclizine , stated she would need a refill of that to [facility] [address].   She stated she has also done research on the Epley maneuver and your not supposed to do that when actively dizzy, stated I'm always dizzy.  Agreeable to referral for rehab

## 2024-07-21 NOTE — Telephone Encounter (Signed)
 She can try taking up to 50 mg of meclizine  every 8 hours to see if this is more helpful.  There are not really any good medications other than meclizine  for dizziness other than Valium  which is not something I would prescribe.  She could also try the Epley maneuver at home.  This is something she can YouTube to see if this is helpful.  She may need referral for vestibular rehab.

## 2024-07-21 NOTE — Telephone Encounter (Addendum)
 FYI Only or Action Required?: Action required by provider: states Antivert  is not working, would like another medication called in..  Patient was last seen in primary care on 05/14/2024 by Antonette Angeline ORN, NP.  Called Nurse Triage reporting Medication Problem.  Symptoms began today.  Interventions attempted: Nothing.  Symptoms are: stable.  Triage Disposition: Call PCP Now, Call PCP When Office is Open  Patient/caregiver understands and will follow disposition?:  Second attempt; no answer First attempt; no answer   Copied from CRM [id number]. Topic: Clinical - Prescription Issue >> Jul 21, 2024 11:49 AM Tracy Kane wrote: Reason for CRM: Pt stated meclizine  (ANTIVERT ) 25 MG tablet is not reducing dizziness. Pt want to know if there is medication more effective. Please would like a call back at [phone]. Pt does have appt on Thurs, Aug. 14th, 2025 with Angeline Antonette NP.

## 2024-07-21 NOTE — Addendum Note (Signed)
 Addended by: ANTONETTE ANGELINE ORN on: 07/21/2024 02:26 PM   Modules accepted: Orders

## 2024-07-23 ENCOUNTER — Ambulatory Visit (INDEPENDENT_AMBULATORY_CARE_PROVIDER_SITE_OTHER): Payer: Medicaid Other | Admitting: Internal Medicine

## 2024-07-23 ENCOUNTER — Encounter: Payer: Self-pay | Admitting: Internal Medicine

## 2024-07-23 VITALS — BP 128/84 | Ht 62.0 in | Wt 259.8 lb

## 2024-07-23 DIAGNOSIS — D75839 Thrombocytosis, unspecified: Secondary | ICD-10-CM

## 2024-07-23 DIAGNOSIS — E7800 Pure hypercholesterolemia, unspecified: Secondary | ICD-10-CM

## 2024-07-23 DIAGNOSIS — K219 Gastro-esophageal reflux disease without esophagitis: Secondary | ICD-10-CM | POA: Diagnosis not present

## 2024-07-23 DIAGNOSIS — H8113 Benign paroxysmal vertigo, bilateral: Secondary | ICD-10-CM | POA: Insufficient documentation

## 2024-07-23 DIAGNOSIS — R739 Hyperglycemia, unspecified: Secondary | ICD-10-CM | POA: Diagnosis not present

## 2024-07-23 DIAGNOSIS — F32A Depression, unspecified: Secondary | ICD-10-CM

## 2024-07-23 DIAGNOSIS — E66813 Obesity, class 3: Secondary | ICD-10-CM

## 2024-07-23 DIAGNOSIS — M150 Primary generalized (osteo)arthritis: Secondary | ICD-10-CM

## 2024-07-23 DIAGNOSIS — F419 Anxiety disorder, unspecified: Secondary | ICD-10-CM

## 2024-07-23 DIAGNOSIS — Z6841 Body Mass Index (BMI) 40.0 and over, adult: Secondary | ICD-10-CM

## 2024-07-23 LAB — LIPID PANEL
Cholesterol: 227 mg/dL — ABNORMAL HIGH (ref <200)
HDL: 59 mg/dL (ref >=50)
LDL Cholesterol (Calc): 145 mg/dL — ABNORMAL HIGH
Non-HDL Cholesterol (Calc): 168 mg/dL — ABNORMAL HIGH (ref <130)
Total CHOL/HDL Ratio: 3.8 (calc) (ref <5.0)
Triglycerides: 113 mg/dL (ref <150)

## 2024-07-23 LAB — HEMOGLOBIN A1C
Hgb A1c MFr Bld: 5.8 % — ABNORMAL HIGH (ref <5.7)
Mean Plasma Glucose: 120 mg/dL
eAG (mmol/L): 6.6 mmol/L

## 2024-07-23 NOTE — Assessment & Plan Note (Signed)
 Encouraged regular stretching and exercise for weight loss as this can help reduce joint pain Continues muscle rubs OTC as needed She will continue to follow with orthopedics

## 2024-07-23 NOTE — Assessment & Plan Note (Signed)
 Advised against abrupt discontinuation of her escitalopram  as this can cause withdrawal symptoms and worsen dizziness She will plan to restart escitalopram  20 mg daily Support offered

## 2024-07-23 NOTE — Progress Notes (Signed)
 Subjective:    Patient ID: Tracy Kane, female    DOB: December 20, 1970, 53 y.o.   MRN: [id number]  HPI  Patient presents to the clinic today for follow-up of chronic conditions.  Anxiety and depression: Chronic, but she is not taking escitalopram  as prescribed. She reports this can worsen with work related stress. She is not currently seeing a therapist.  She denies SI/HI.  GERD: She is not sure what triggers this, maybe eating and laying down.  She takes omeprazole  only as needed with good relief of symptoms.  There is no upper GI on file.  OA: Mainly in her neck, back and hips.  She is s/p nerve ablation and injections. She uses bengay, icy hot, or biofreze OTC.   MRI lumbar spine from 08/2022 reviewed.  She follows with orthopedics.  HLD: Her last LDL was 132, triglycerides 108, 01/2024.  She is not taking atorvastatin  as prescribed, stopped this because she thought it may be causing her dizziness.  She has been trying to consume a low-fat diet.  Thrombocytosis: Her last platelet count was 414, 04/2024.  She does not follow with hematology.  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 Symptoms occur daily, and worse with movement of her head bilaterally. She denies syncope.  She reports the meclizine  25 mg TID is not helping much, but has had some improvement with taking the meclizine  50 mgTID along with zyrtec 10 mg daily.  She has been referred to vestibular rehab but this appt has not been made yet.  Review of Systems     Past Medical History:  Diagnosis Date   Anxiety    Arthritis    Depression    Hyperlipidemia     Current Outpatient Medications  Medication Sig Dispense Refill   meclizine  (ANTIVERT ) 25 MG tablet Take 1 tablet (25 mg total) by mouth 3 (three) times daily as needed for dizziness. 90 tablet 0   atorvastatin  (LIPITOR) 20 MG tablet Take 1 tablet (20 mg total) by mouth daily. 90 tablet 1   escitalopram  (LEXAPRO ) 20  MG tablet Take 1 tablet by mouth once daily 90 tablet 1   omeprazole  (PRILOSEC) 20 MG capsule Take 1 capsule (20 mg total) by mouth daily as needed. 90 capsule 1   ondansetron  (ZOFRAN ) 4 MG tablet Take 1 tablet (4 mg total) by mouth every 6 (six) hours as needed for nausea. 20 tablet 0   No current facility-administered medications for this visit.    No Known Allergies  Family History  Problem Relation Age of Onset   Breast cancer Paternal Grandmother     Social History   Socioeconomic History   Marital status: Single    Spouse name: Not on file   Number of children: Not on file   Years of education: Not on file   Highest education level: Not on file  Occupational History   Not on file  Tobacco Use   Smoking status: Never   Smokeless tobacco: Never  Vaping Use   Vaping status: Never Used  Substance and Sexual Activity   Alcohol use: Yes    Comment: occasionally   Drug use: No   Sexual activity: Not Currently  Other Topics Concern   Not on file  Social History Narrative   Not on file   Social Drivers of Health   Financial Resource Strain: Not on file  Food Insecurity: No Food Insecurity (05/08/2024)   Hunger Vital Sign    Worried  About Running Out of Food in the Last Year: Never true    Ran Out of Food in the Last Year: Never true  Transportation Needs: No Transportation Needs (05/08/2024)   PRAPARE - Administrator, Civil Service (Medical): No    Lack of Transportation (Non-Medical): No  Physical Activity: Not on file  Stress: Not on file  Social Connections: Patient Declined (05/08/2024)   Social Connection and Isolation Panel    Frequency of Communication with Friends and Family: Patient declined    Frequency of Social Gatherings with Friends and Family: Patient declined    Attends Religious Services: Patient declined    Database administrator or Organizations: Patient declined    Attends Banker Meetings: Patient declined    Marital  Status: Patient declined  Intimate Partner Violence: Not At Risk (05/08/2024)   Humiliation, Afraid, Rape, and Kick questionnaire    Fear of Current or Ex-Partner: No    Emotionally Abused: No    Physically Abused: No    Sexually Abused: No     Constitutional: Denies fever, malaise, fatigue, headache or abrupt weight changes.  HEENT: Denies eye pain, eye redness, ear pain, ringing in the ears, wax buildup, runny nose, nasal congestion, bloody nose, or sore throat. Respiratory: Denies difficulty breathing, shortness of breath, cough or sputum production.   Cardiovascular: Denies chest pain, chest tightness, palpitations or swelling in the hands or feet.  Gastrointestinal: Patient reports intermittent reflux.  Denies abdominal pain, bloating, constipation, diarrhea or blood in the stool.  GU: Denies urgency, frequency, pain with urination, burning sensation, blood in urine, odor or discharge. Musculoskeletal: Patient reports chronic neck, back and hip pain.  Denies decrease in range of motion, difficulty with gait, muscle pain or joint swelling.  Skin: Denies redness, rashes, lesions or ulcercations.  Neurological: Patient reports dizziness, paresthesia of lower extremities.  Denies difficulty with memory, difficulty with speech or problems with balance and coordination.  Psych: Patient has a history of anxiety and depression.  Denies SI/HI.  No other specific complaints in a complete review of systems (except as listed in HPI above).  Objective:   Physical Exam BP 128/84 (BP Location: Right Arm, Patient Position: Sitting, Cuff Size: Large)   Ht 5' 2 (1.575 m)   Wt 259 lb 12.8 oz (117.8 kg)   LMP 11/07/2023 (Approximate)   BMI 47.52 kg/m    Wt Readings from Last 3 Encounters:  05/14/24 245 lb (111.1 kg)  05/08/24 245 lb (111.1 kg)  01/24/24 248 lb 12.8 oz (112.9 kg)    General: Appears her stated age, obese, in NAD. Skin: Warm, dry and intact.  HEENT: Head: normal shape and  size; Eyes: sclera white, no icterus, conjunctiva pink, PERRLA and EOMs intact;  Ear's: small amount of wax buildup in the left ear, none in the right. Ear canals are small and curved making it difficult to visualize TM. Cardiovascular: Normal rate and rhythm. S1,S2 noted.  No murmur, rubs or gallops noted. No JVD or BLE edema. Pulmonary/Chest: Normal effort and positive vesicular breath sounds. No respiratory distress. No wheezes, rales or ronchi noted.  Abdomen: Soft and nontender.  Musculoskeletal:  Strength 5/5 BLE. No difficulty with gait.  Neurological: Alert and oriented. Coordination normal.  Psychiatric: Mood and affect normal. Behavior is normal. Judgment and thought content normal.     BMET    Component Value Date/Time   NA 138 05/09/2024 0519   NA 139 09/05/2021 1414   K 4.0 05/09/2024  0519   CL 104 05/09/2024 0519   CO2 29 05/09/2024 0519   GLUCOSE 90 05/09/2024 0519   BUN 7 05/09/2024 0519   BUN 6 09/05/2021 1414   CREATININE 0.56 05/09/2024 0519   CREATININE 0.69 01/24/2024 0846   CALCIUM  8.6 (L) 05/09/2024 0519   GFRNONAA >60 05/09/2024 0519   GFRAA >60 05/18/2017 1952    Lipid Panel     Component Value Date/Time   CHOL 212 (H) 01/24/2024 0846   CHOL 214 (H) 09/05/2021 1414   TRIG 108 01/24/2024 0846   HDL 58 01/24/2024 0846   HDL 53 09/05/2021 1414   CHOLHDL 3.7 01/24/2024 0846   LDLCALC 132 (H) 01/24/2024 0846    CBC    Component Value Date/Time   WBC 7.4 05/09/2024 0519   RBC 4.22 05/09/2024 0519   HGB 12.7 05/09/2024 0519   HGB 14.0 09/05/2021 1414   HCT 39.4 05/09/2024 0519   HCT 42.1 09/05/2021 1414   PLT 414 (H) 05/09/2024 0519   PLT 402 09/05/2021 1414   MCV 93.4 05/09/2024 0519   MCV 90 09/05/2021 1414   MCH 30.1 05/09/2024 0519   MCHC 32.2 05/09/2024 0519   RDW 13.2 05/09/2024 0519   RDW 12.7 09/05/2021 1414    Hgb A1C Lab Results  Component Value Date   HGBA1C 5.6 01/24/2024            Assessment & Plan:      RTC  in 6 months for your annual exam Angeline Laura, NP

## 2024-07-23 NOTE — Patient Instructions (Signed)
 Benign Positional Vertigo Vertigo is the feeling that you or your surroundings are moving when they are not. Benign positional vertigo is the most common form of vertigo. This is usually a harmless condition (benign). This condition is positional. This means that symptoms are triggered by certain movements and positions. This condition can be dangerous if it occurs while you are doing something that could cause harm to yourself or others. This includes activities such as driving or operating machinery. What are the causes? The inner ear has fluid-filled canals that help your brain sense movement and balance. When the fluid moves, the brain receives messages about your body's position. With benign positional vertigo, calcium crystals in the inner ear break free and disturb the inner ear area. This causes your brain to receive confusing messages about your body's position. What increases the risk? You are more likely to develop this condition if: You are a woman. You are 51 years of age or older. You have recently had a head injury. You have an inner ear disease. What are the signs or symptoms? Symptoms of this condition usually happen when you move your head or your eyes in different directions. Symptoms may start suddenly and usually last for less than a minute. They include: Loss of balance and falling. Feeling like you are spinning or moving. Feeling like your surroundings are spinning or moving. Nausea and vomiting. Blurred vision. Dizziness. Involuntary eye movement (nystagmus). Symptoms can be mild and cause only minor problems, or they can be severe and interfere with daily life. Episodes of benign positional vertigo may return (recur) over time. Symptoms may also improve over time. How is this diagnosed? This condition may be diagnosed based on: Your medical history. A physical exam of the head, neck, and ears. Positional tests to check for or stimulate vertigo. You may be asked to  turn your head and change positions, such as going from sitting to lying down. A health care provider will watch for symptoms of vertigo. You may be referred to a health care provider who specializes in ear, nose, and throat problems (ENT or otolaryngologist) or a provider who specializes in disorders of the nervous system (neurologist). How is this treated?  This condition may be treated in a session in which your health care provider moves your head in specific positions to help the displaced crystals in your inner ear move. Treatment for this condition may take several sessions. Surgery may be needed in severe cases, but this is rare. In some cases, benign positional vertigo may resolve on its own in 2-4 weeks. Follow these instructions at home: Safety Move slowly. Avoid sudden body or head movements or certain positions, as told by your health care provider. Avoid driving or operating machinery until your health care provider says it is safe. Avoid doing any tasks that would be dangerous to you or others if vertigo occurs. If you have trouble walking or keeping your balance, try using a cane for stability. If you feel dizzy or unstable, sit down right away. Return to your normal activities as told by your health care provider. Ask your health care provider what activities are safe for you. General instructions Take over-the-counter and prescription medicines only as told by your health care provider. Drink enough fluid to keep your urine pale yellow. Keep all follow-up visits. This is important. Contact a health care provider if: You have a fever. Your condition gets worse or you develop new symptoms. Your family or friends notice any behavioral changes.  You have nausea or vomiting that gets worse. You have numbness or a prickling and tingling sensation. Get help right away if you: Have difficulty speaking or moving. Are always dizzy or faint. Develop severe headaches. Have weakness in  your legs or arms. Have changes in your hearing or vision. Develop a stiff neck. Develop sensitivity to light. These symptoms may represent a serious problem that is an emergency. Do not wait to see if the symptoms will go away. Get medical help right away. Call your local emergency services (911 in the [place] Do not drive yourself to the hospital. Summary Vertigo is the feeling that you or your surroundings are moving when they are not. Benign positional vertigo is the most common form of vertigo. This condition is caused by calcium crystals in the inner ear that become displaced. This causes a disturbance in an area of the inner ear that helps your brain sense movement and balance. Symptoms include loss of balance and falling, feeling that you or your surroundings are moving, nausea and vomiting, and blurred vision. This condition can be diagnosed based on symptoms, a physical exam, and positional tests. Follow safety instructions as told by your health care provider and keep all follow-up visits. This is important. This information is not intended to replace advice given to you by your health care provider. Make sure you discuss any questions you have with your health care provider. Document Revised: 06/17/2023 Document Reviewed: 06/17/2023 Elsevier Patient Education  2024 [employer].

## 2024-07-23 NOTE — Assessment & Plan Note (Signed)
 C-Met and lipid profile today Encouraged her to consume a low-fat diet Advised her to restart atorvastatin  20 mg daily

## 2024-07-23 NOTE — Assessment & Plan Note (Signed)
 Avoid foods that trigger reflux Encouraged weight loss as this can help reduce reflux symptoms Continue omeprazole  20 mg daily as needed

## 2024-07-23 NOTE — Assessment & Plan Note (Signed)
 Encourage diet and exercise for weight loss

## 2024-07-23 NOTE — Assessment & Plan Note (Signed)
 She will continue to take zyrtec 10 mg daily and meclizine  10 mg TID prn Encouraged adequate hydration She has already been referred to vestibular rehab, waiting for this appt to be made

## 2024-07-23 NOTE — Assessment & Plan Note (Signed)
 CBC reviewed Will monitor

## 2024-07-24 ENCOUNTER — Ambulatory Visit: Payer: Self-pay | Admitting: Internal Medicine

## 2024-08-13 ENCOUNTER — Encounter: Payer: Self-pay | Admitting: Internal Medicine

## 2024-08-13 DIAGNOSIS — T7840XA Allergy, unspecified, initial encounter: Secondary | ICD-10-CM

## 2024-08-17 NOTE — Addendum Note (Signed)
 Addended by: ANTONETTE ANGELINE ORN on: 08/17/2024 09:09 AM   Modules accepted: Orders

## 2024-08-21 DIAGNOSIS — Z419 Encounter for procedure for purposes other than remedying health state, unspecified: Secondary | ICD-10-CM | POA: Diagnosis not present

## 2024-09-18 ENCOUNTER — Other Ambulatory Visit: Payer: Self-pay | Admitting: Internal Medicine

## 2024-09-20 DIAGNOSIS — Z419 Encounter for procedure for purposes other than remedying health state, unspecified: Secondary | ICD-10-CM | POA: Diagnosis not present

## 2024-09-21 NOTE — Telephone Encounter (Signed)
 Requested medications are due for refill today.  yes  Requested medications are on the active medications list.  yes  Last refill. 07/21/2024 #90 0 rf  Future visit scheduled.   yes  Notes to clinic.  Refill not delegated.    Requested Prescriptions  Pending Prescriptions Disp Refills   meclizine  (ANTIVERT ) 25 MG tablet [Pharmacy Med Name: Meclizine  HCl 25 MG Oral Tablet] 90 tablet 0    Sig: TAKE 1 TABLET BY MOUTH THREE TIMES DAILY AS NEEDED FOR DIZZINESS     Not Delegated - Gastroenterology: Antiemetics Failed - 09/21/2024  2:53 PM      Failed - This refill cannot be delegated      Passed - Valid encounter within last 6 months    Recent Outpatient Visits           2 months ago Pure hypercholesterolemia   [place] [place], [place] W, NP   4 months ago Benign paroxysmal positional vertigo due to bilateral vestibular disorder   [place] [facility] [place], Angeline ORN, NP   8 months ago Encounter for general adult medical examination with abnormal findings   [place] [facility] [place], Angeline ORN, NP   8 months ago Dizziness    [facility] [place], Angeline ORN, [place]

## 2024-11-20 DIAGNOSIS — Z419 Encounter for procedure for purposes other than remedying health state, unspecified: Secondary | ICD-10-CM | POA: Diagnosis not present

## 2025-01-12 ENCOUNTER — Ambulatory Visit: Admitting: Internal Medicine

## 2025-01-12 NOTE — Progress Notes (Unsigned)
 "  Virtual Visit via Video Note  I connected with Tracy Kane on 01/12/25 at  2:40 PM EST by a video enabled telemedicine application and verified that I am speaking with the correct person using two identifiers.  Location: Patient: *** Provider: Office  Person's participating in this video call: Angeline Laura, NP-C and Syvilla Sandner   I discussed the limitations of evaluation and management by telemedicine and the availability of in person appointments. The patient expressed understanding and agreed to proceed.  History of Present Illness:    Past Medical History:  Diagnosis Date   Anxiety    Arthritis    Depression    Hyperlipidemia     Current Outpatient Medications  Medication Sig Dispense Refill   atorvastatin  (LIPITOR) 20 MG tablet Take 1 tablet (20 mg total) by mouth daily. (Patient not taking: Reported on 07/23/2024) 90 tablet 1   escitalopram  (LEXAPRO ) 20 MG tablet Take 1 tablet by mouth once daily 90 tablet 1   meclizine  (ANTIVERT ) 25 MG tablet TAKE 1 TABLET BY MOUTH THREE TIMES DAILY AS NEEDED FOR DIZZINESS 90 tablet 0   omeprazole  (PRILOSEC) 20 MG capsule Take 1 capsule (20 mg total) by mouth daily as needed. 90 capsule 1   ondansetron  (ZOFRAN ) 4 MG tablet Take 1 tablet (4 mg total) by mouth every 6 (six) hours as needed for nausea. 20 tablet 0   No current facility-administered medications for this visit.    Allergies[1]  Family History  Problem Relation Age of Onset   Breast cancer Paternal Grandmother     Social History   Socioeconomic History   Marital status: Single    Spouse name: Not on file   Number of children: Not on file   Years of education: Not on file   Highest education level: Not on file  Occupational History   Not on file  Tobacco Use   Smoking status: Never   Smokeless tobacco: Never  Vaping Use   Vaping status: Never Used  Substance and Sexual Activity   Alcohol use: Yes    Comment: occasionally   Drug use: No   Sexual  activity: Not Currently  Other Topics Concern   Not on file  Social History Narrative   Not on file   Social Drivers of Health   Tobacco Use: Low Risk  (09/07/2024)   Received from [facility]   Patient History    Smoking Tobacco Use: Never    Smokeless Tobacco Use: Never    Passive Exposure: Never  Financial Resource Strain: Low Risk  (09/07/2024)   Received from [facility]   Overall Financial Resource Strain (CARDIA)    Difficulty of Paying Living Expenses: Not hard at all  Food Insecurity: No Food Insecurity (09/07/2024)   Received from [facility]   Epic    Within the past 12 months, you worried that your food would run out before you got the money to buy more.: Never true    Within the past 12 months, the food you bought just didn't last and you didn't have money to get more.: Never true  Transportation Needs: No Transportation Needs (09/07/2024)   Received from [facility] - Transportation    In the past 12 months, has lack of transportation kept you from medical appointments or from getting medications?: No    Lack of Transportation (Non-Medical): No  Physical Activity: Not on file  Stress: Not on file  Social  Connections: Patient Declined (05/08/2024)   Social Connection and Isolation Panel    Frequency of Communication with Friends and Family: Patient declined    Frequency of Social Gatherings with Friends and Family: Patient declined    Attends Religious Services: Patient declined    Database Administrator or Organizations: Patient declined    Attends Banker Meetings: Patient declined    Marital Status: Patient declined  Intimate Partner Violence: Not At Risk (05/08/2024)   Humiliation, Afraid, Rape, and Kick questionnaire    Fear of Current or Ex-Partner: No    Emotionally Abused: No    Physically Abused: No    Sexually Abused: No  Depression (PHQ2-9): Low Risk (07/23/2024)    Depression (PHQ2-9)    PHQ-2 Score: 0  Alcohol Screen: Low Risk (06/25/2023)   Alcohol Screen    Last Alcohol Screening Score (AUDIT): 3  Housing: Low Risk  (09/07/2024)   Received from [facility]   Epic    In the last 12 months, was there a time when you were not able to pay the mortgage or rent on time?: No    In the past 12 months, how many times have you moved where you were living?: 0    At any time in the past 12 months, were you homeless or living in a shelter (including now)?: No  Utilities: Not At Risk (09/07/2024)   Received from [facility]   Epic    In the past 12 months has the electric, gas, oil, or water company threatened to shut off services in your home?: No  Health Literacy: Not on file     Constitutional: Denies fever, malaise, fatigue, headache or abrupt weight changes.  HEENT: Denies eye pain, eye redness, ear pain, ringing in the ears, wax buildup, runny nose, nasal congestion, bloody nose, or sore throat. Respiratory: Denies difficulty breathing, shortness of breath, cough or sputum production.   Cardiovascular: Denies chest pain, chest tightness, palpitations or swelling in the hands or feet.  Gastrointestinal: Denies abdominal pain, bloating, constipation, diarrhea or blood in the stool.  GU: Denies urgency, frequency, pain with urination, burning sensation, blood in urine, odor or discharge. Musculoskeletal: Patient reports back pain.  Denies decrease in range of motion, difficulty with gait, muscle pain or joint swelling.  Skin: Denies redness, rashes, lesions or ulcercations.  Neurological: Denies dizziness, difficulty with memory, difficulty with speech or problems with balance and coordination.  Psych: Denies anxiety, depression, SI/HI.  No other specific complaints in a complete review of systems (except as listed in HPI above).  Observations/Objective:  There were no vitals taken for this visit. Wt Readings from  Last 3 Encounters:  07/23/24 259 lb 12.8 oz (117.8 kg)  05/14/24 245 lb (111.1 kg)  05/08/24 245 lb (111.1 kg)    General: Appears their stated age, well developed, well nourished in NAD. Skin: Warm, dry and intact. No rashes, lesions or ulcerations noted. HEENT: Head: normal shape and size; Eyes: sclera white, no icterus, conjunctiva pink, PERRLA and EOMs intact; Ears: Tm's gray and intact, normal light reflex; Nose: mucosa pink and moist, septum midline; Throat/Mouth: Teeth present, mucosa pink and moist, no exudate, lesions or ulcerations noted.  Neck:  Neck supple, trachea midline. No masses, lumps or thyromegaly present.  Cardiovascular: Normal rate and rhythm. S1,S2 noted.  No murmur, rubs or gallops noted. No JVD or BLE edema. No carotid bruits noted. Pulmonary/Chest: Normal effort and positive vesicular breath sounds. No respiratory distress.  No wheezes, rales or ronchi noted.  Abdomen: Soft and nontender. Normal bowel sounds. No distention or masses noted. Liver, spleen and kidneys non palpable. Musculoskeletal: Normal range of motion. No signs of joint swelling. No difficulty with gait.  Neurological: Alert and oriented. Cranial nerves II-XII grossly intact. Coordination normal.  Psychiatric: Mood and affect normal. Behavior is normal. Judgment and thought content normal.   EKG:  BMET    Component Value Date/Time   NA 138 05/09/2024 0519   NA 139 09/05/2021 1414   K 4.0 05/09/2024 0519   CL 104 05/09/2024 0519   CO2 29 05/09/2024 0519   GLUCOSE 90 05/09/2024 0519   BUN 7 05/09/2024 0519   BUN 6 09/05/2021 1414   CREATININE 0.56 05/09/2024 0519   CREATININE 0.69 01/24/2024 0846   CALCIUM  8.6 (L) 05/09/2024 0519   GFRNONAA >60 05/09/2024 0519   GFRAA >60 05/18/2017 1952    Lipid Panel     Component Value Date/Time   CHOL 227 (H) 07/23/2024 0917   CHOL 214 (H) 09/05/2021 1414   TRIG 113 07/23/2024 0917   HDL 59 07/23/2024 0917   HDL 53 09/05/2021 1414   CHOLHDL 3.8  07/23/2024 0917   LDLCALC 145 (H) 07/23/2024 0917    CBC    Component Value Date/Time   WBC 7.4 05/09/2024 0519   RBC 4.22 05/09/2024 0519   HGB 12.7 05/09/2024 0519   HGB 14.0 09/05/2021 1414   HCT 39.4 05/09/2024 0519   HCT 42.1 09/05/2021 1414   PLT 414 (H) 05/09/2024 0519   PLT 402 09/05/2021 1414   MCV 93.4 05/09/2024 0519   MCV 90 09/05/2021 1414   MCH 30.1 05/09/2024 0519   MCHC 32.2 05/09/2024 0519   RDW 13.2 05/09/2024 0519   RDW 12.7 09/05/2021 1414    Hgb A1C Lab Results  Component Value Date   HGBA1C 5.8 (H) 07/23/2024       Assessment and Plan:  RTC in 2 weeks for your annual exam Follow Up Instructions:    I discussed the assessment and treatment plan with the patient. The patient was provided an opportunity to ask questions and all were answered. The patient agreed with the plan and demonstrated an understanding of the instructions.   The patient was advised to call back or seek an in-person evaluation if the symptoms worsen or if the condition fails to improve as anticipated.   Angeline Laura, NP     [1] No Known Allergies  "

## 2025-01-25 ENCOUNTER — Encounter: Admitting: Internal Medicine
